# Patient Record
Sex: Male | Born: 1960
Health system: Southern US, Community
[De-identification: ages and names within clinical notes are randomized; demographics above are authoritative.]

## PROBLEM LIST (undated history)

## (undated) DIAGNOSIS — I10 Essential (primary) hypertension: Secondary | ICD-10-CM

---

## 2004-07-30 ENCOUNTER — Ambulatory Visit (HOSPITAL_COMMUNITY): Admission: RE | Admit: 2004-07-30 | Discharge: 2004-07-30 | Payer: Self-pay | Admitting: Internal Medicine

## 2010-09-20 ENCOUNTER — Encounter: Payer: Self-pay | Admitting: Internal Medicine

## 2014-01-18 ENCOUNTER — Ambulatory Visit: Payer: BC Managed Care – PPO | Admitting: Family Medicine

## 2014-01-18 VITALS — BP 130/72 | HR 90 | Temp 98.0°F | Resp 18 | Ht 69.0 in | Wt 151.0 lb

## 2014-01-18 DIAGNOSIS — B356 Tinea cruris: Secondary | ICD-10-CM

## 2014-01-18 MED ORDER — TERBINAFINE HCL 1 % EX CREA: 1.0000 "application " | TOPICAL_CREAM | Freq: Two times a day (BID) | CUTANEOUS | Status: DC

## 2014-01-18 NOTE — Progress Notes (Signed)
   Subjective:    Patient ID: George Andrews, male    DOB: March 01, 1961, 53 y.o.   MRN: [id number]  HPI Patient presents today with a rash on his inner, upper thighs for about 3 weeks. He used A&D ointment and it seemed like it was getting better, then got worse. He is currently using A&D alternating with baby powder.   No itch, no drainage. He has never had a rash like this before.  Patient works third shift and walks a lot, this is irritating when his skin rubs together.  Patient sees Dr. Rande LawmanErwin ? on [address]for regular primary care. Is planning to go in for a regular check up and schedule his colonoscopy within the next couple of months.   Review of Systems No fever/chills, no lesions on testicles, penis.    Objective:   Physical Exam  Vitals reviewed. Constitutional: He is oriented to person, place, and time. He appears well-developed and well-nourished.  Eyes: Conjunctivae are normal. Right eye exhibits no discharge. Left eye exhibits no discharge. No scleral icterus.  Neck: Normal range of motion. Neck supple.  Pulmonary/Chest: Effort normal.  Musculoskeletal: Normal range of motion.  Neurological: He is alert and oriented to person, place, and time.  Skin: Skin is warm and dry. Rash noted.  Bilateral upper inner thighs with 2 inch, round erythematous, flat, discoloration with a darker boarder. Evidence of powder.  Psychiatric: He has a normal mood and affect. His behavior is normal. Judgment and thought content normal.       Assessment & Plan:  1. Tinea of groin - terbinafine (LAMISIL AT) 1 % cream; Apply 1 application topically 2 (two) times daily.  Dispense: 30 g; Refill: 0. Instructed patient to use until rash resolved plus one week. -did not do skin scraping today due to copious amount of powder on skin -Patient to return if no improvement in 3 weeks, or sooner if worsening/spreading. -Recommended compression shorts to prevent skin from rubbing.  Emi Belfasteborah B.  Jazmynn Pho, FNP-BC  [facility]  01/18/2014 11:55 AM

## 2014-01-18 NOTE — Patient Instructions (Signed)
Use prescription cream twice a day until rash resolved then for 1 more week, If not better in 2 weeks or if worse, RTC.  Jock Itch Jock itch is a fungal infection of the skin in the groin area. It is sometimes called "ringworm" even though it is not caused by a worm. A fungus is a type of germ that thrives in dark, damp places.  CAUSES  This infection may spread from:  A fungus infection elsewhere on the body (such as athlete's foot).  Sharing towels or clothing. This infection is more common in:  Hot, humid climates.  People who wear tight-fitting clothing or wet bathing suits for long periods of time.  Athletes.  Overweight people.  People with diabetes. SYMPTOMS  Jock itch causes the following symptoms:  Red, pink or brown rash in the groin. Rash may spread to the thighs, anus, and buttocks.  Itching. DIAGNOSIS  Your caregiver may make the diagnosis by looking at the rash. Sometimes a skin scraping will be sent to test for fungus. Testing can be done either by looking under the microscope or by doing a culture (test to try to grow the fungus). A culture can take up to 2 weeks to come back. TREATMENT  Jock itch may be treated with:  Skin cream or ointment to kill fungus.  Medicine by mouth to kill fungus.  Skin cream or ointment to calm the itching.  Compresses or medicated powders to dry the infected skin. HOME CARE INSTRUCTIONS   Be sure to treat the rash completely. Follow your caregiver's instructions. It can take a couple of weeks to treat. If you do not treat the infection long enough, the rash can come back.  Wear loose-fitting clothing.  Men should wear cotton boxer shorts.  Women should wear cotton underwear.  Avoid hot baths.  Dry the groin area well after bathing. SEEK MEDICAL CARE IF:   Your rash is worse.  Your rash is spreading.  Your rash returns after treatment is finished.  Your rash is not gone in 4 weeks. Fungal infections are slow to  respond to treatment. Some redness may remain for several weeks after the fungus is gone. SEEK IMMEDIATE MEDICAL CARE IF:  The area becomes red, warm, tender, and swollen.  You have a fever. Document Released: 08/07/2002 Document Revised: 11/09/2011 Document Reviewed: 07/06/2008 [facility] Patient Information 2014 [employer].

## 2014-11-05 ENCOUNTER — Other Ambulatory Visit (HOSPITAL_COMMUNITY): Payer: Self-pay

## 2014-11-05 ENCOUNTER — Emergency Department (HOSPITAL_COMMUNITY): Payer: BLUE CROSS/BLUE SHIELD

## 2014-11-05 ENCOUNTER — Encounter (HOSPITAL_COMMUNITY): Payer: Self-pay | Admitting: Emergency Medicine

## 2014-11-05 ENCOUNTER — Inpatient Hospital Stay (HOSPITAL_COMMUNITY)
Admission: EM | Admit: 2014-11-05 | Discharge: 2014-11-06 | DRG: 087 | Disposition: A | Discharge status: Home / Self Care | Payer: BLUE CROSS/BLUE SHIELD | Attending: Internal Medicine | Admitting: Internal Medicine

## 2014-11-05 DIAGNOSIS — S066X0A Traumatic subarachnoid hemorrhage without loss of consciousness, initial encounter: Secondary | ICD-10-CM | POA: Diagnosis not present

## 2014-11-05 DIAGNOSIS — I10 Essential (primary) hypertension: Secondary | ICD-10-CM | POA: Diagnosis present

## 2014-11-05 DIAGNOSIS — Y908 Blood alcohol level of 240 mg/100 ml or more: Secondary | ICD-10-CM | POA: Diagnosis present

## 2014-11-05 DIAGNOSIS — I609 Nontraumatic subarachnoid hemorrhage, unspecified: Secondary | ICD-10-CM | POA: Diagnosis not present

## 2014-11-05 DIAGNOSIS — R030 Elevated blood-pressure reading, without diagnosis of hypertension: Secondary | ICD-10-CM

## 2014-11-05 DIAGNOSIS — F1010 Alcohol abuse, uncomplicated: Secondary | ICD-10-CM | POA: Diagnosis not present

## 2014-11-05 DIAGNOSIS — F10129 Alcohol abuse with intoxication, unspecified: Secondary | ICD-10-CM | POA: Diagnosis not present

## 2014-11-05 DIAGNOSIS — F10120 Alcohol abuse with intoxication, uncomplicated: Secondary | ICD-10-CM | POA: Diagnosis present

## 2014-11-05 DIAGNOSIS — S066X9A Traumatic subarachnoid hemorrhage with loss of consciousness of unspecified duration, initial encounter: Secondary | ICD-10-CM

## 2014-11-05 DIAGNOSIS — IMO0001 Reserved for inherently not codable concepts without codable children: Secondary | ICD-10-CM | POA: Diagnosis present

## 2014-11-05 DIAGNOSIS — W1830XA Fall on same level, unspecified, initial encounter: Secondary | ICD-10-CM | POA: Diagnosis present

## 2014-11-05 DIAGNOSIS — R4020 Unspecified coma: Secondary | ICD-10-CM

## 2014-11-05 DIAGNOSIS — R55 Syncope and collapse: Secondary | ICD-10-CM | POA: Diagnosis not present

## 2014-11-05 LAB — I-STAT CHEM 8, ED
BUN: 12 mg/dL (ref 6–23)
Calcium, Ion: 0.92 mmol/L — ABNORMAL LOW (ref 1.12–1.23)
Chloride: 103 mmol/L (ref 96–112)
Creatinine, Ser: 1.1 mg/dL (ref 0.50–1.35)
Glucose, Bld: 114 mg/dL — ABNORMAL HIGH (ref 70–99)
HCT: 52 % (ref 39.0–52.0)
Hemoglobin: 17.7 g/dL — ABNORMAL HIGH (ref 13.0–17.0)
Potassium: 6 mmol/L — ABNORMAL HIGH (ref 3.5–5.1)
Sodium: 141 mmol/L (ref 135–145)
TCO2: 24 mmol/L (ref 0–100)

## 2014-11-05 LAB — CBC WITH DIFFERENTIAL/PLATELET
Basophils Absolute: 0 10*3/uL (ref 0.0–0.1)
Basophils Relative: 0 % (ref 0–1)
Eosinophils Absolute: 0 10*3/uL (ref 0.0–0.7)
Eosinophils Relative: 0 % (ref 0–5)
HCT: 47.6 % (ref 39.0–52.0)
Hemoglobin: 16.4 g/dL (ref 13.0–17.0)
Lymphocytes Relative: 17 % (ref 12–46)
Lymphs Abs: 1.7 10*3/uL (ref 0.7–4.0)
MCH: 32.2 pg (ref 26.0–34.0)
MCHC: 34.5 g/dL (ref 30.0–36.0)
MCV: 93.5 fL (ref 78.0–100.0)
Monocytes Absolute: 0.8 10*3/uL (ref 0.1–1.0)
Monocytes Relative: 8 % (ref 3–12)
Neutro Abs: 8 10*3/uL — ABNORMAL HIGH (ref 1.7–7.7)
Neutrophils Relative %: 75 % (ref 43–77)
Platelets: 196 10*3/uL (ref 150–400)
RBC: 5.09 MIL/uL (ref 4.22–5.81)
RDW: 14.4 % (ref 11.5–15.5)
WBC: 10.5 10*3/uL (ref 4.0–10.5)

## 2014-11-05 LAB — COMPREHENSIVE METABOLIC PANEL
ALT: 72 U/L — ABNORMAL HIGH (ref 0–53)
AST: 71 U/L — ABNORMAL HIGH (ref 0–37)
Albumin: 4 g/dL (ref 3.5–5.2)
Alkaline Phosphatase: 66 U/L (ref 39–117)
Anion gap: 11 (ref 5–15)
BUN: 9 mg/dL (ref 6–23)
CO2: 25 mmol/L (ref 19–32)
Calcium: 8.6 mg/dL (ref 8.4–10.5)
Chloride: 104 mmol/L (ref 96–112)
Creatinine, Ser: 0.76 mg/dL (ref 0.50–1.35)
GFR calc Af Amer: >90 mL/min (ref >90)
GFR calc non Af Amer: >90 mL/min (ref >90)
Glucose, Bld: 108 mg/dL — ABNORMAL HIGH (ref 70–99)
Potassium: 4.1 mmol/L (ref 3.5–5.1)
Sodium: 140 mmol/L (ref 135–145)
Total Bilirubin: 1.1 mg/dL (ref 0.3–1.2)
Total Protein: 8.1 g/dL (ref 6.0–8.3)

## 2014-11-05 LAB — ETHANOL: Alcohol, Ethyl (B): 256 mg/dL — ABNORMAL HIGH (ref 0–9)

## 2014-11-05 LAB — CBG MONITORING, ED: Glucose-Capillary: 139 mg/dL — ABNORMAL HIGH (ref 70–99)

## 2014-11-05 LAB — MAGNESIUM: Magnesium: 2.2 mg/dL (ref 1.5–2.5)

## 2014-11-05 MED ORDER — OXYCODONE-ACETAMINOPHEN 5-325 MG PO TABS
1.0000 | ORAL_TABLET | ORAL | Status: DC | PRN
  Administered 2014-11-05 – 2014-11-06 (×4): 2 via ORAL
  Filled 2014-11-05 (×5): qty 2

## 2014-11-05 MED ORDER — DIPHENHYDRAMINE HCL 50 MG/ML IJ SOLN
12.5000 mg | Freq: Once | INTRAMUSCULAR | Status: AC
  Administered 2014-11-05: 12.5 mg via INTRAVENOUS
  Filled 2014-11-05: qty 1

## 2014-11-05 MED ORDER — LORAZEPAM 2 MG/ML IJ SOLN: 1.0000 mg | Freq: Four times a day (QID) | INTRAMUSCULAR | Status: DC | PRN

## 2014-11-05 MED ORDER — MORPHINE SULFATE 2 MG/ML IJ SOLN
2.0000 mg | INTRAMUSCULAR | Status: DC | PRN
  Administered 2014-11-05: 2 mg via INTRAVENOUS
  Filled 2014-11-05: qty 1

## 2014-11-05 MED ORDER — THIAMINE HCL 100 MG/ML IJ SOLN: 100.0000 mg | Freq: Every day | INTRAMUSCULAR | Status: DC

## 2014-11-05 MED ORDER — ADULT MULTIVITAMIN W/MINERALS CH
1.0000 | ORAL_TABLET | Freq: Every day | ORAL | Status: DC
  Administered 2014-11-05 – 2014-11-06 (×2): 1 via ORAL
  Filled 2014-11-05 (×2): qty 1

## 2014-11-05 MED ORDER — ACETAMINOPHEN 650 MG RE SUPP: 650.0000 mg | Freq: Four times a day (QID) | RECTAL | Status: DC | PRN

## 2014-11-05 MED ORDER — IOHEXOL 350 MG/ML SOLN
50.0000 mL | Freq: Once | INTRAVENOUS | Status: AC | PRN
  Administered 2014-11-05: 50 mL via INTRAVENOUS

## 2014-11-05 MED ORDER — LORAZEPAM 1 MG PO TABS: 1.0000 mg | ORAL_TABLET | Freq: Four times a day (QID) | ORAL | Status: DC | PRN

## 2014-11-05 MED ORDER — FOLIC ACID 1 MG PO TABS
1.0000 mg | ORAL_TABLET | Freq: Every day | ORAL | Status: DC
  Administered 2014-11-05 – 2014-11-06 (×2): 1 mg via ORAL
  Filled 2014-11-05 (×2): qty 1

## 2014-11-05 MED ORDER — ACETAMINOPHEN 325 MG PO TABS
650.0000 mg | ORAL_TABLET | Freq: Four times a day (QID) | ORAL | Status: DC | PRN
Start: 2014-11-05 — End: 2014-11-06
  Administered 2014-11-05: 650 mg via ORAL
  Filled 2014-11-05: qty 2

## 2014-11-05 MED ORDER — METOCLOPRAMIDE HCL 5 MG/ML IJ SOLN
10.0000 mg | Freq: Once | INTRAMUSCULAR | Status: AC
  Administered 2014-11-05: 10 mg via INTRAVENOUS
  Filled 2014-11-05: qty 2

## 2014-11-05 MED ORDER — VITAMIN B-1 100 MG PO TABS
100.0000 mg | ORAL_TABLET | Freq: Every day | ORAL | Status: DC
  Administered 2014-11-05 – 2014-11-06 (×2): 100 mg via ORAL
  Filled 2014-11-05 (×2): qty 1

## 2014-11-05 NOTE — ED Notes (Signed)
Pt given sandwich 

## 2014-11-05 NOTE — Progress Notes (Signed)
Pt in room 4N29. Pt oriented to unit and equipment. Call bell and phone within reach. BP on arrival to unit was 175/73, otherwise VSS. MD made aware that pt on unit. Will continue to monitor.

## 2014-11-05 NOTE — H&P (Signed)
Date: 11/05/2014               Patient Name:  George Andrews MRN: [id number]  DOB: [birth year] Age / Sex: 54 y.o., male   PCP: No primary care provider on file.              Medical Service: Internal Medicine Teaching Service              Attending Physician: Dr. Earl Lagos, MD    First Contact: Dr. Leatha Gilding  Pager: [phone]  Second Contact: Dr. Mariea Clonts Pager: [phone]            After Hours (After 5p/  First Contact Pager: [phone]  weekends / holidays): Second Contact Pager: [phone]   Chief Complaint: Severe headache  History of Present Illness: This is a 54 year old gentleman without significant past medical history except alcohol abuse who presents with severe headaches. Patient reports that last night he went to the kitchen to get some drinking water and fell down. He woke up this morning on the floor with severe frontal headache. He denies vomiting, photophobia, fevers or chills. He states that he had been drinking whole day yesterday and he that he consumed 1 pint of Brandy (liquor). He states that he drinks daily, "a few drinks" of liquor. He describes his headache as throbbing, frontal, constant, without relieving or aggravating factors. He denies neck pain or stiffness. No visual or auditory symptoms. No lightheadedness. He is able to ambulate without support. He does have a small cut on his right ear. No past medical history of seizures or similar episodes of passing out. Alcohol level is 256. Head CT scan without contrast in the ED revealed acute subarachnoid hemorrhage in the medullary cistern, pre-pontine cistern and suprasellar cistern. Follow-up CTA of the head was negative for aneurysm. Internal medicine was consulted for admission and neurosurgery will be seeing the patient in consultation.  Review of Systems: Constitutional: No fever, chills, diaphoresis, appetite change and fatigue.  HEENT: No URI symptoms.  Respiratory: No SOB, DOE, cough, chest tightness, and  wheezing. No hemoptysis.  Cardiovascular: No chest pain, palpitations and leg swelling. No PND or Orthopnea. Gastrointestinal: No N/V or diarrhea. No abdominal pain. No hematochezia or melena.  Genitourinary: No dysuria, urgency, frequency, hematuria, flank pain and difficulty urinating.  Musculoskeletal: No myalgias, back pain, joint swelling, arthralgias and gait problem.  Skin: No rash and wound. No easy bruising. Psychiatric/Behavioral: No SI, mood changes, confusion, nervousness, sleep disturbance and agitation  Meds: Current Facility-Administered Medications  Medication Dose Route Frequency Provider Last Rate Last Dose  . acetaminophen (TYLENOL) tablet 650 mg  650 mg Oral Q6H PRN Dow Adolph, MD   650 mg at 11/05/14 1349   Or  . acetaminophen (TYLENOL) suppository 650 mg  650 mg Rectal Q6H PRN Dow Adolph, MD      . folic acid (FOLVITE) tablet 1 mg  1 mg Oral Daily Dow Adolph, MD   1 mg at 11/05/14 1257  . LORazepam (ATIVAN) tablet 1 mg  1 mg Oral Q6H PRN Dow Adolph, MD       Or  . LORazepam (ATIVAN) injection 1 mg  1 mg Intravenous Q6H PRN Dow Adolph, MD      . multivitamin with minerals tablet 1 tablet  1 tablet Oral Daily Dow Adolph, MD   1 tablet at 11/05/14 1257  . thiamine (VITAMIN B-1) tablet 100 mg  100 mg Oral Daily Dow Adolph, MD   100 mg at 11/05/14 1258  Or  . thiamine (B-1) injection 100 mg  100 mg Intravenous Daily Dow Adolphichard Demarkis Gheen, MD        Allergies: Allergies as of 11/05/2014  . (No Known Allergies)   History reviewed. No pertinent past medical history. History reviewed. No pertinent past surgical history. No family history on file. History   Social History  . Marital Status: Single    Spouse Name: N/A  . Number of Children: N/A  . Years of Education: N/A   Occupational History  . Not on file.   Social History Main Topics  . Smoking status: Never Smoker   . Smokeless tobacco: Not on file  . Alcohol Use: Yes  . Drug  Use: Not on file  . Sexual Activity: Not on file   Other Topics Concern  . Not on file   Social History Narrative    Physical Exam: Blood pressure 175/73, pulse 81, temperature 99.7 F (37.6 C), temperature source Oral, resp. rate 15, SpO2 98 %.  General: Smell of alcohol in his breath. well developed, well nourished; no acute distress, cooperative with exam HEENT: Neck is supple. Head is Atraumatic except a small cut on his right pinna. Not actively bleeding. Normal EOM. Pupils equal, round and reactive; anicteric. Nose/throat: oropharynx clear, moist mucous membranes, pink gums  Lungs/Chest wall: CTA bil, normal work of breathing  Heart: normal RRR; no murmurs. No JVD Pulses: radial and dorsalis pedis pulses are 2+ and symmetric  Abdomen: Normal fullness, no rebound, guarding, or rigidity; nl BS; no palpable masses.  Skin: warm, dry, intact, normal turgor, no rashes  Extremities: no peripheral edema, clubbing, or cyanosis  Neurologic Exam:   Mental Status: Alert, oriented, thought content appropriate.  Speech fluent without evidence of aphasia. Able to follow 3 step commands without difficulty.  Cranial Nerves:   II: Visual fields grossly intact.  III/IV/VI: Extraocular movements intact.  Pupils reactive bilaterally.  V/VII: Smile symmetric. Facial light touch sensation normal bilaterally.  VIII: Grossly intact.  XI: Bilateral shoulder shrug normal.  XII: Midline tongue extension normal.  Motor:  5/5 bilaterally with normal tone and bulk  Sensory:  Pinprick and light touch intact throughout, bilaterally  DTRs: 2+ and symmetric throughout  Plantars:  Downgoing bilaterally  Cerebellar: Normal finger-to-nose, normal rapid alternating movements and normal heel-to-shin test.  Gait not tested.   Psych: Normal mood and affect. Normal speech and behavior. No agitation   Lab results: Basic Metabolic Panel:  Recent Labs  46/96/2902/03/15 0808 11/05/14 0838  NA 141 140  K 6.0* 4.1    CL 103 104  CO2  --  25  GLUCOSE 114* 108*  BUN 12 9  CREATININE 1.10 0.76  CALCIUM  --  8.6  MG  --  2.2   Liver Function Tests:  Recent Labs  11/05/14 0838  AST 71*  ALT 72*  ALKPHOS 66  BILITOT 1.1  PROT 8.1  ALBUMIN 4.0   CBC:  Recent Labs  11/05/14 0808 11/05/14 0838  WBC  --  10.5  NEUTROABS  --  8.0*  HGB 17.7* 16.4  HCT 52.0 47.6  MCV  --  93.5  PLT  --  196   CBG:  Recent Labs  11/05/14 0541  GLUCAP 139*   Alcohol Level:  Recent Labs  11/05/14 0800  ETH 256*    Imaging results:  Ct Angio Head W/cm &/or Wo Cm  11/05/2014   CLINICAL DATA:  Fall related to alcohol intoxication last night. Awoke on floor. Laceration to  the right ear. Frontal headache. Subarachnoid hemorrhage  EXAM: CT ANGIOGRAPHY HEAD  TECHNIQUE: Multidetector CT imaging of the head was performed using the standard protocol during bolus administration of intravenous contrast. Multiplanar CT image reconstructions and MIPs were obtained to evaluate the vascular anatomy.  CONTRAST:  50mL OMNIPAQUE IOHEXOL 350 MG/ML SOLN  COMPARISON:  CT head without contrast from the same day.  FINDINGS: CT HEAD  Brain: Pre pontine subarachnoid hemorrhage is again noted. There is asymmetric hemorrhage at the left CP angle. No acute infarct is evident.  Calvarium and skull base: A 6 mm hyperdensity is noted in the right external auditory canal at the junction of the membranous an osseous segments. The calvarium and skullbase are otherwise unremarkable.  Paranasal sinuses: Clear  Orbits: Within normal limits  CTA HEAD  Anterior circulation: Atherosclerotic calcifications are present within the ophthalmic segments of the internal carotid arteries bilaterally. There is some irregularity throughout the cavernous internal carotid arteries. The terminal ICA is normal bilaterally. The A1 and M1 segments are normal. The anterior communicating artery is patent. The MCA bifurcations are within normal limits. There is some  attenuation of distal MCA branch vessels bilaterally without a significant proximal stenosis or occlusion.  Posterior circulation: The vertebral arteries are codominant. The right PICA origin is below the foramen magnum. The left PICA origin is visualized and within normal limits. There is no aneurysm. The vertebrobasilar junction is normal. The basilar artery is within normal limits. The posterior cerebral arteries both originate from the basilar tip. There is significant contribution on the right from a posterior communicating artery. There is some attenuation of distal PCA branch vessels similar to the MCA disease.  Venous sinuses: The dural sinuses are patent. The left transverse sinus is dominant.  Anatomic variants: None.  Delayed phase:No postcontrast enhancement is evident. Of note, subarachnoid hemorrhage does not seem to enter the interpeduncular cistern.  IMPRESSION: 1. Pre pontine and left CP angle subarachnoid hemorrhage is stable. 2. No discrete aneurysm or evidence for focal dissection. The pattern is typical of non aneurysmal subarachnoid hemorrhage in the posterior fossa. 3. Mild atherosclerotic changes within the cavernous and ophthalmic segments of the internal carotid arteries. 4. Segmental attenuation of distal MCA and PCA branch vessels bilaterally suggesting atherosclerotic change or vasculitis. 5. 6 mm hyperdensity within the right external auditory canal. Recommend direct visualization to evaluate for debris versus cutaneous or subcutaneous lesion.   Electronically Signed   By: Marin Roberts M.D.   On: 11/05/2014 10:13   Dg Chest 2 View  11/05/2014   CLINICAL DATA:  Fall, hit head with headache  EXAM: CHEST  2 VIEW  COMPARISON:  None.  FINDINGS: Normal mediastinum and cardiac silhouette. Normal pulmonary vasculature. No evidence of effusion, infiltrate, or pneumothorax. No acute bony abnormality.  IMPRESSION: No acute cardiopulmonary process.   Electronically Signed   By: Genevive Bi M.D.   On: 11/05/2014 10:16   Ct Head Wo Contrast  11/05/2014   CLINICAL DATA:  Frontal headache. Fall/syncope. Laceration along the right ear.  EXAM: CT HEAD WITHOUT CONTRAST  TECHNIQUE: Contiguous axial images were obtained from the base of the skull through the vertex without intravenous contrast.  COMPARISON:  None.  FINDINGS: There is abnormal subarachnoid hemorrhage at the foramen magnum and tracking in the medullary cistern, the prepontine cistern, and suprasellar cistern. There is likely a small amount of hemorrhage in the fourth ventricle inferiorly.  Otherwise, the brainstem, cerebellum, cerebral peduncles, thalamus, basal ganglia, basilar cisterns, and ventricular  system appear within normal limits. No mass lesion or acute CVA is identified.  IMPRESSION: 1. Acute subarachnoid hemorrhage in the medullary cistern, pre pontine cistern, and suprasellar cistern. Cannot exclude a small amount of hemorrhage in the fourth ventricle inferiorly. Critical Value/emergent results were called by telephone at the time of interpretation on 11/05/2014 at 7:50 am to Dr. Charlestine Night , who verbally acknowledged these results.   Electronically Signed   By: Gaylyn Rong M.D.   On: 11/05/2014 07:50   Ct Cervical Spine Wo Contrast  11/05/2014   CLINICAL DATA:  Patient woke up on floor. Does not remember following.  EXAM: CT CERVICAL SPINE WITHOUT CONTRAST  TECHNIQUE: Multidetector CT imaging of the cervical spine was performed without intravenous contrast. Multiplanar CT image reconstructions were also generated.  COMPARISON:  None.  FINDINGS: Normal alignment. Prevertebral soft tissues are normal. Disc spaces are maintained with early anterior spurring. No fracture. No epidural or paraspinal hematoma.  Severe emphysematous changes noted in the lung apices. Complete lucency in the visualize left apex is presumably a large bulla although it is difficult to completely exclude pneumothorax on this limited  visualization. May consider chest x-ray to confirm.  IMPRESSION: No acute bony abnormality.  Severe emphysematous changes in the apices. Visualized lucency in the left apex is presumably a large bulla although it is difficult to completely exclude pneumothorax on these limited images. May consider chest x-ray.   Electronically Signed   By: Charlett Nose M.D.   On: 11/05/2014 08:47    Other results: EKG: normal EKG, normal sinus rhythm.  Assessment & Plan by Problem: Principal Problem:   Subarachnoid hemorrhage Active Problems:   Alcohol abuse with intoxication   Elevated blood pressure  Subarachnoid hemorrhage: Likely secondary to head trauma with a fall in the setting of alcohol intoxication. Other causes of syncope are not supported by history and physical exam. No signs of meningeal irritation. EKG without abnormalities. Seborrheic hemorrhage is evident on head CT without contrast and CTA with involvement of medullary cistern, prepontine cistern and suprasellar cistern. No aneurysms. Cervical CT scan without contrast does not reveal cervical fracture. Chest x-ray without acute cardiopulmonary process, specifically no pneumothorax. Exam is otherwise normal.   Plan -Admit to MedSurg -Frequent neurologic monitoring. -Aggressive treatment of elevated BP to avoid further bleeding. Keep SBP <160 mmHg -Will hold off on Prophylactic antiepileptic drug since the bleed appears small -Tylenol for headache prn -Neurosurgery has been consulted in the ED. EDP stated that they did not feel that surgery is indicated.  Elevated blood pressure: Patient does not have a past medical history of hypertension.  Plan -Start Amlodipine 5 mg daily -IV Hydralazine 5 mg every 4hr prn for SBP of above 1 60 mmHg -Titrate oral antihypertensives based on blood pressure  Alcohol abuse: Slight elevation in liver function test likely related to alcohol. Patient counseled on alcohol cessation. Plan -Will start on CIWA  protocol.  -Check liver functions and if they remain elevated, may pursue further etiologies including hepatitis.  Minor Ear Laceration: No intervention required.  Code Status: Full code  F/E/N: regular diet  VTE Ppx: SCDs. Avoid heparin products in the setting of SAH  Family Communication: Discussed a plan of care with the patient. All questions answered.  Dispo: Disposition is deferred at this time, awaiting improvement of current medical problems. Anticipated discharge in approximately 1-2 day(s).   The patient does not have a current PCP (No primary care provider on file.), therefore will be require [facility]PC follow-up  after discharge.   The patient does not have transportation limitations that hinder transportation to clinic appointments.  Signed:  Dow Adolph, MD PGY-3 Internal Medicine Teaching Service Pager [phone] 11/05/2014, 1:56 PM

## 2014-11-05 NOTE — Consult Note (Signed)
Reason for Consult: Subarachnoid hemorrhage Referring Physician: Dr. Leroy Libman is an 54 y.o. male.  HPI: The patient is a 54 year old black male who woke up with a headache in the early a.m. on 11/05/2014. The patient then stood up and either fell or had a syncopal event. EMS was called and the patient was taken to [facility] emerged part where head CT was obtained. As demonstrated a subarachnoid hemorrhage. The patient has been admitted by the hospitalists and a neurosurgical consultation has been requested.  Presently the patient is alert and pleasant. He admits to a headache. He denies trauma other than the fall after he had a headache.  History reviewed. No pertinent past medical history.  History reviewed. No pertinent past surgical history.  No family history on file.  Social History:  reports that he has been smoking.  He does not have any smokeless tobacco history on file. He reports that he drinks alcohol. His drug history is not on file.  Allergies: No Known Allergies  Medications:  I have reviewed the patient's current medications. Prior to Admission:  Prescriptions prior to admission  Medication Sig Dispense Refill Last Dose  . Multiple Vitamins-Minerals (CENTRUM ADULTS PO) Take 1 tablet by mouth daily.   11/04/2014 at Unknown time  . terbinafine (LAMISIL AT) 1 % cream Apply 1 application topically 2 (two) times daily. (Patient not taking: Reported on 11/05/2014) 30 g 0    Scheduled: . folic acid  1 mg Oral Daily  . multivitamin with minerals  1 tablet Oral Daily  . thiamine  100 mg Oral Daily   Or  . thiamine  100 mg Intravenous Daily   Continuous:  VVK:[id number] **OR** acetaminophen, LORazepam **OR** LORazepam, morphine injection, oxyCODONE-acetaminophen Anti-infectives    None       Results for orders placed or performed during the hospital encounter of 11/05/14 (from the past 48 hour(s))  CBG monitoring, ED     Status: Abnormal   Collection  Time: 11/05/14  5:41 AM  Result Value Ref Range   Glucose-Capillary 139 (H) 70 - 99 mg/dL  Ethanol     Status: Abnormal   Collection Time: 11/05/14  8:00 AM  Result Value Ref Range   Alcohol, Ethyl (B) 256 (H) 0 - 9 mg/dL    Comment:        LOWEST DETECTABLE LIMIT FOR SERUM ALCOHOL IS 11 mg/dL FOR MEDICAL PURPOSES ONLY   I-stat Chem 8, ED     Status: Abnormal   Collection Time: 11/05/14  8:08 AM  Result Value Ref Range   Sodium 141 135 - 145 mmol/L   Potassium 6.0 (H) 3.5 - 5.1 mmol/L   Chloride 103 96 - 112 mmol/L   BUN 12 6 - 23 mg/dL   Creatinine, Ser 1.10 0.50 - 1.35 mg/dL   Glucose, Bld 114 (H) 70 - 99 mg/dL   Calcium, Ion 0.92 (L) 1.12 - 1.23 mmol/L   TCO2 24 0 - 100 mmol/L   Hemoglobin 17.7 (H) 13.0 - 17.0 g/dL   HCT 52.0 39.0 - 52.0 %  Comprehensive metabolic panel     Status: Abnormal   Collection Time: 11/05/14  8:38 AM  Result Value Ref Range   Sodium 140 135 - 145 mmol/L   Potassium 4.1 3.5 - 5.1 mmol/L   Chloride 104 96 - 112 mmol/L   CO2 25 19 - 32 mmol/L   Glucose, Bld 108 (H) 70 - 99 mg/dL   BUN 9 6 - 23  mg/dL   Creatinine, Ser 0.76 0.50 - 1.35 mg/dL   Calcium 8.6 8.4 - 10.5 mg/dL   Total Protein 8.1 6.0 - 8.3 g/dL   Albumin 4.0 3.5 - 5.2 g/dL   AST 71 (H) 0 - 37 U/L   ALT 72 (H) 0 - 53 U/L   Alkaline Phosphatase 66 39 - 117 U/L   Total Bilirubin 1.1 0.3 - 1.2 mg/dL   GFR calc non Af Amer >90 >90 mL/min   GFR calc Af Amer >90 >90 mL/min    Comment: (NOTE) The eGFR has been calculated using the CKD EPI equation. This calculation has not been validated in all clinical situations. eGFR's persistently <90 mL/min signify possible Chronic Kidney Disease.    Anion gap 11 5 - 15  CBC with Differential     Status: Abnormal   Collection Time: 11/05/14  8:38 AM  Result Value Ref Range   WBC 10.5 4.0 - 10.5 K/uL   RBC 5.09 4.22 - 5.81 MIL/uL   Hemoglobin 16.4 13.0 - 17.0 g/dL   HCT 47.6 39.0 - 52.0 %   MCV 93.5 78.0 - 100.0 fL   MCH 32.2 26.0 - 34.0 pg    MCHC 34.5 30.0 - 36.0 g/dL   RDW 14.4 11.5 - 15.5 %   Platelets 196 150 - 400 K/uL   Neutrophils Relative % 75 43 - 77 %   Neutro Abs 8.0 (H) 1.7 - 7.7 K/uL   Lymphocytes Relative 17 12 - 46 %   Lymphs Abs 1.7 0.7 - 4.0 K/uL   Monocytes Relative 8 3 - 12 %   Monocytes Absolute 0.8 0.1 - 1.0 K/uL   Eosinophils Relative 0 0 - 5 %   Eosinophils Absolute 0.0 0.0 - 0.7 K/uL   Basophils Relative 0 0 - 1 %   Basophils Absolute 0.0 0.0 - 0.1 K/uL  Magnesium     Status: None   Collection Time: 11/05/14  8:38 AM  Result Value Ref Range   Magnesium 2.2 1.5 - 2.5 mg/dL    Ct Angio Head W/cm &/or Wo Cm  11/05/2014   CLINICAL DATA:  Fall related to alcohol intoxication last night. Awoke on floor. Laceration to the right ear. Frontal headache. Subarachnoid hemorrhage  EXAM: CT ANGIOGRAPHY HEAD  TECHNIQUE: Multidetector CT imaging of the head was performed using the standard protocol during bolus administration of intravenous contrast. Multiplanar CT image reconstructions and MIPs were obtained to evaluate the vascular anatomy.  CONTRAST:  38m OMNIPAQUE IOHEXOL 350 MG/ML SOLN  COMPARISON:  CT head without contrast from the same day.  FINDINGS: CT HEAD  Brain: Pre pontine subarachnoid hemorrhage is again noted. There is asymmetric hemorrhage at the left CP angle. No acute infarct is evident.  Calvarium and skull base: A 6 mm hyperdensity is noted in the right external auditory canal at the junction of the membranous an osseous segments. The calvarium and skullbase are otherwise unremarkable.  Paranasal sinuses: Clear  Orbits: Within normal limits  CTA HEAD  Anterior circulation: Atherosclerotic calcifications are present within the ophthalmic segments of the internal carotid arteries bilaterally. There is some irregularity throughout the cavernous internal carotid arteries. The terminal ICA is normal bilaterally. The A1 and M1 segments are normal. The anterior communicating artery is patent. The MCA  bifurcations are within normal limits. There is some attenuation of distal MCA branch vessels bilaterally without a significant proximal stenosis or occlusion.  Posterior circulation: The vertebral arteries are codominant. The right PICA origin is  below the foramen magnum. The left PICA origin is visualized and within normal limits. There is no aneurysm. The vertebrobasilar junction is normal. The basilar artery is within normal limits. The posterior cerebral arteries both originate from the basilar tip. There is significant contribution on the right from a posterior communicating artery. There is some attenuation of distal PCA branch vessels similar to the MCA disease.  Venous sinuses: The dural sinuses are patent. The left transverse sinus is dominant.  Anatomic variants: None.  Delayed phase:No postcontrast enhancement is evident. Of note, subarachnoid hemorrhage does not seem to enter the interpeduncular cistern.  IMPRESSION: 1. Pre pontine and left CP angle subarachnoid hemorrhage is stable. 2. No discrete aneurysm or evidence for focal dissection. The pattern is typical of non aneurysmal subarachnoid hemorrhage in the posterior fossa. 3. Mild atherosclerotic changes within the cavernous and ophthalmic segments of the internal carotid arteries. 4. Segmental attenuation of distal MCA and PCA branch vessels bilaterally suggesting atherosclerotic change or vasculitis. 5. 6 mm hyperdensity within the right external auditory canal. Recommend direct visualization to evaluate for debris versus cutaneous or subcutaneous lesion.   Electronically Signed   By: San Morelle M.D.   On: 11/05/2014 10:13   Dg Chest 2 View  11/05/2014   CLINICAL DATA:  Fall, hit head with headache  EXAM: CHEST  2 VIEW  COMPARISON:  None.  FINDINGS: Normal mediastinum and cardiac silhouette. Normal pulmonary vasculature. No evidence of effusion, infiltrate, or pneumothorax. No acute bony abnormality.  IMPRESSION: No acute  cardiopulmonary process.   Electronically Signed   By: Suzy Bouchard M.D.   On: 11/05/2014 10:16   Ct Head Wo Contrast  11/05/2014   CLINICAL DATA:  Frontal headache. Fall/syncope. Laceration along the right ear.  EXAM: CT HEAD WITHOUT CONTRAST  TECHNIQUE: Contiguous axial images were obtained from the base of the skull through the vertex without intravenous contrast.  COMPARISON:  None.  FINDINGS: There is abnormal subarachnoid hemorrhage at the foramen magnum and tracking in the medullary cistern, the prepontine cistern, and suprasellar cistern. There is likely a small amount of hemorrhage in the fourth ventricle inferiorly.  Otherwise, the brainstem, cerebellum, cerebral peduncles, thalamus, basal ganglia, basilar cisterns, and ventricular system appear within normal limits. No mass lesion or acute CVA is identified.  IMPRESSION: 1. Acute subarachnoid hemorrhage in the medullary cistern, pre pontine cistern, and suprasellar cistern. Cannot exclude a small amount of hemorrhage in the fourth ventricle inferiorly. Critical Value/emergent results were called by telephone at the time of interpretation on 11/05/2014 at 7:50 am to Dr. Dalia Heading , who verbally acknowledged these results.   Electronically Signed   By: Van Clines M.D.   On: 11/05/2014 07:50   Ct Cervical Spine Wo Contrast  11/05/2014   CLINICAL DATA:  Patient woke up on floor. Does not remember following.  EXAM: CT CERVICAL SPINE WITHOUT CONTRAST  TECHNIQUE: Multidetector CT imaging of the cervical spine was performed without intravenous contrast. Multiplanar CT image reconstructions were also generated.  COMPARISON:  None.  FINDINGS: Normal alignment. Prevertebral soft tissues are normal. Disc spaces are maintained with early anterior spurring. No fracture. No epidural or paraspinal hematoma.  Severe emphysematous changes noted in the lung apices. Complete lucency in the visualize left apex is presumably a large bulla although it is  difficult to completely exclude pneumothorax on this limited visualization. May consider chest x-ray to confirm.  IMPRESSION: No acute bony abnormality.  Severe emphysematous changes in the apices. Visualized lucency in the left  apex is presumably a large bulla although it is difficult to completely exclude pneumothorax on these limited images. May consider chest x-ray.   Electronically Signed   By: Rolm Baptise M.D.   On: 11/05/2014 08:47    ROS: As above Blood pressure 175/73, pulse 81, temperature 99.7 F (37.6 C), temperature source Oral, resp. rate 15, SpO2 98 %. Physical Exam  General: An alert and pleasant 54 year old black male in no apparent distress.  HEENT: Normocephalic, atraumatic, pupils equal round reactive light, extraocular muscles are intact.  Neck: Supple without masses or deformities.  Thorax: Symmetric  Abdomen: Soft  Extremities: Unremarkable  Neurologic exam: The patient is alert and oriented 3. Glasgow Coma Scale 15. Cranial nerves II through XII were examined bilaterally and grossly normal. Vision and hearing are grossly normal bilaterally. Motor strength is 5 over 5 in his bowel bicep, tricep, handgrip, gastrocnemius, dorsiflexors. Sensory function is intact to light touch sensation in all tested dermatomes bilaterally. Cerebellar function is intact to rapid alternating movements of the upper extremities bilaterally.  I have reviewed the patient's head CT performed without contrast at [facility] today. It demonstrates blood around the brainstem and posterior fossa. There is a lot of blood in the ambient cistern or out in the sylvian fissures.  I've also reviewed the patient's cervical CT. There is no fractures.  I have reviewed the report of the patient's CT angiogram which demonstrates no aneurysms  Assessment/Plan: Nontraumatic subarachnoid hemorrhage: The patient's subarachnoid hemorrhage may be a perimesencephalic hemorrhage. No aneurysms are  identified on the CT angiogram. I don't think the patient needs nimodipine. He will be observed overnight and will plan to repeat his CAT scan tomorrow. If there is no significant progression of bleeding, hydrocephalus, etc. the patient can be discharged tomorrow. I have answered all the patient's questions.  Joie Reamer D 11/05/2014, 3:11 PM

## 2014-11-05 NOTE — ED Provider Notes (Signed)
CSN: [id number]     Arrival date & time 11/05/14  0531 History   First MD Initiated Contact with Patient 11/05/14 [phone]     Chief Complaint  Patient presents with  . Headache  . Alcohol Intoxication     (Consider location/radiation/quality/duration/timing/severity/associated sxs/prior Treatment) HPI Patient presents to the emergency department with a sudden onset of headache and woke up on the floor in his house.  Patient states that he does drink alcohol daily.  The patient is unsure what happened that caused him to fall onto the floor.  Patient states that he did not take any medications prior to arrival for his headache.  Patient denies chest pain, shortness of breath, weakness, dizziness, blurred vision, back pain, neck pain, fever, nausea, vomiting or abdominal pain.  The patient states that he must of hit his ear is a little swollen abrasion to the ear on the right History reviewed. No pertinent past medical history. History reviewed. No pertinent past surgical history. No family history on file. History  Substance Use Topics  . Smoking status: Never Smoker   . Smokeless tobacco: Not on file  . Alcohol Use: Yes    Review of Systems  All other systems negative except as documented in the HPI. All pertinent positives and negatives as reviewed in the HPI.   Allergies  Review of patient's allergies indicates no known allergies.  Home Medications   Prior to Admission medications   Medication Sig Start Date End Date Taking? Authorizing Provider  Multiple Vitamins-Minerals (CENTRUM ADULTS PO) Take 1 tablet by mouth daily.   Yes Historical Provider, MD  terbinafine (LAMISIL AT) 1 % cream Apply 1 application topically 2 (two) times daily. Patient not taking: Reported on 11/05/2014 01/18/14   Emi Belfast, FNP   BP 162/74 mmHg  Pulse 82  Resp 15  SpO2 98% Physical Exam  Constitutional: He is oriented to person, place, and time. He appears well-developed and well-nourished. No  distress.  HENT:  Head: Normocephalic.  Mouth/Throat: Oropharynx is clear and moist.  Eyes: EOM are normal. Pupils are equal, round, and reactive to light.  Neck: Normal range of motion. Neck supple.  Cardiovascular: Normal rate, regular rhythm and normal heart sounds.  Exam reveals no gallop and no friction rub.   No murmur heard. Pulmonary/Chest: Effort normal and breath sounds normal. No respiratory distress.  Neurological: He is alert and oriented to person, place, and time. He exhibits normal muscle tone. Coordination normal.  Skin: Skin is warm and dry. No rash noted. No erythema.  Nursing note and vitals reviewed.   ED Course  Procedures (including critical care time) Labs Review Labs Reviewed  COMPREHENSIVE METABOLIC PANEL - Abnormal; Notable for the following:    Glucose, Bld 108 (*)    AST 71 (*)    ALT 72 (*)    All other components within normal limits  CBC WITH DIFFERENTIAL/PLATELET - Abnormal; Notable for the following:    Neutro Abs 8.0 (*)    All other components within normal limits  CBG MONITORING, ED - Abnormal; Notable for the following:    Glucose-Capillary 139 (*)    All other components within normal limits  I-STAT CHEM 8, ED - Abnormal; Notable for the following:    Potassium 6.0 (*)    Glucose, Bld 114 (*)    Calcium, Ion 0.92 (*)    Hemoglobin 17.7 (*)    All other components within normal limits  ETHANOL  I-STAT CHEM 8, ED  Imaging Review Ct Angio Head W/cm &/or Wo Cm  11/05/2014   CLINICAL DATA:  Fall related to alcohol intoxication last night. Awoke on floor. Laceration to the right ear. Frontal headache. Subarachnoid hemorrhage  EXAM: CT ANGIOGRAPHY HEAD  TECHNIQUE: Multidetector CT imaging of the head was performed using the standard protocol during bolus administration of intravenous contrast. Multiplanar CT image reconstructions and MIPs were obtained to evaluate the vascular anatomy.  CONTRAST:  50mL OMNIPAQUE IOHEXOL 350 MG/ML SOLN   COMPARISON:  CT head without contrast from the same day.  FINDINGS: CT HEAD  Brain: Pre pontine subarachnoid hemorrhage is again noted. There is asymmetric hemorrhage at the left CP angle. No acute infarct is evident.  Calvarium and skull base: A 6 mm hyperdensity is noted in the right external auditory canal at the junction of the membranous an osseous segments. The calvarium and skullbase are otherwise unremarkable.  Paranasal sinuses: Clear  Orbits: Within normal limits  CTA HEAD  Anterior circulation: Atherosclerotic calcifications are present within the ophthalmic segments of the internal carotid arteries bilaterally. There is some irregularity throughout the cavernous internal carotid arteries. The terminal ICA is normal bilaterally. The A1 and M1 segments are normal. The anterior communicating artery is patent. The MCA bifurcations are within normal limits. There is some attenuation of distal MCA branch vessels bilaterally without a significant proximal stenosis or occlusion.  Posterior circulation: The vertebral arteries are codominant. The right PICA origin is below the foramen magnum. The left PICA origin is visualized and within normal limits. There is no aneurysm. The vertebrobasilar junction is normal. The basilar artery is within normal limits. The posterior cerebral arteries both originate from the basilar tip. There is significant contribution on the right from a posterior communicating artery. There is some attenuation of distal PCA branch vessels similar to the MCA disease.  Venous sinuses: The dural sinuses are patent. The left transverse sinus is dominant.  Anatomic variants: None.  Delayed phase:No postcontrast enhancement is evident. Of note, subarachnoid hemorrhage does not seem to enter the interpeduncular cistern.  IMPRESSION: 1. Pre pontine and left CP angle subarachnoid hemorrhage is stable. 2. No discrete aneurysm or evidence for focal dissection. The pattern is typical of non aneurysmal  subarachnoid hemorrhage in the posterior fossa. 3. Mild atherosclerotic changes within the cavernous and ophthalmic segments of the internal carotid arteries. 4. Segmental attenuation of distal MCA and PCA branch vessels bilaterally suggesting atherosclerotic change or vasculitis. 5. 6 mm hyperdensity within the right external auditory canal. Recommend direct visualization to evaluate for debris versus cutaneous or subcutaneous lesion.   Electronically Signed   By: Marin Robertshristopher  Mattern M.D.   On: 11/05/2014 10:13   Dg Chest 2 View  11/05/2014   CLINICAL DATA:  Fall, hit head with headache  EXAM: CHEST  2 VIEW  COMPARISON:  None.  FINDINGS: Normal mediastinum and cardiac silhouette. Normal pulmonary vasculature. No evidence of effusion, infiltrate, or pneumothorax. No acute bony abnormality.  IMPRESSION: No acute cardiopulmonary process.   Electronically Signed   By: Genevive BiStewart  Edmunds M.D.   On: 11/05/2014 10:16   Ct Head Wo Contrast  11/05/2014   CLINICAL DATA:  Frontal headache. Fall/syncope. Laceration along the right ear.  EXAM: CT HEAD WITHOUT CONTRAST  TECHNIQUE: Contiguous axial images were obtained from the base of the skull through the vertex without intravenous contrast.  COMPARISON:  None.  FINDINGS: There is abnormal subarachnoid hemorrhage at the foramen magnum and tracking in the medullary cistern, the prepontine cistern, and suprasellar  cistern. There is likely a small amount of hemorrhage in the fourth ventricle inferiorly.  Otherwise, the brainstem, cerebellum, cerebral peduncles, thalamus, basal ganglia, basilar cisterns, and ventricular system appear within normal limits. No mass lesion or acute CVA is identified.  IMPRESSION: 1. Acute subarachnoid hemorrhage in the medullary cistern, pre pontine cistern, and suprasellar cistern. Cannot exclude a small amount of hemorrhage in the fourth ventricle inferiorly. Critical Value/emergent results were called by telephone at the time of interpretation  on 11/05/2014 at 7:50 am to Dr. Charlestine Night , who verbally acknowledged these results.   Electronically Signed   By: Gaylyn Rong M.D.   On: 11/05/2014 07:50   Ct Cervical Spine Wo Contrast  11/05/2014   CLINICAL DATA:  Patient woke up on floor. Does not remember following.  EXAM: CT CERVICAL SPINE WITHOUT CONTRAST  TECHNIQUE: Multidetector CT imaging of the cervical spine was performed without intravenous contrast. Multiplanar CT image reconstructions were also generated.  COMPARISON:  None.  FINDINGS: Normal alignment. Prevertebral soft tissues are normal. Disc spaces are maintained with early anterior spurring. No fracture. No epidural or paraspinal hematoma.  Severe emphysematous changes noted in the lung apices. Complete lucency in the visualize left apex is presumably a large bulla although it is difficult to completely exclude pneumothorax on this limited visualization. May consider chest x-ray to confirm.  IMPRESSION: No acute bony abnormality.  Severe emphysematous changes in the apices. Visualized lucency in the left apex is presumably a large bulla although it is difficult to completely exclude pneumothorax on these limited images. May consider chest x-ray.   Electronically Signed   By: Charlett Nose M.D.   On: 11/05/2014 08:47    The patient be admitted to the hospital.  I spoke with neurosurgery and the [facility] about the patient.  The neurosurgeon has come down to evaluate the patient   MDM   Final diagnoses:  None        Charlestine Night, PA-C 11/06/14 1556  Richardean Canal, MD 11/06/14 [phone]

## 2014-11-05 NOTE — ED Notes (Signed)
Pt arrives with c/o headache, states he woke up on the floor, doesn't recall losing consciousness, falling, hitting head, etc. Admits to ETOH intake. Minor laceration to R ear.

## 2014-11-06 ENCOUNTER — Inpatient Hospital Stay (HOSPITAL_COMMUNITY): Payer: BLUE CROSS/BLUE SHIELD

## 2014-11-06 DIAGNOSIS — F1010 Alcohol abuse, uncomplicated: Secondary | ICD-10-CM

## 2014-11-06 MED ORDER — HYDRALAZINE HCL 20 MG/ML IJ SOLN
5.0000 mg | Freq: Four times a day (QID) | INTRAMUSCULAR | Status: DC | PRN
  Administered 2014-11-06: 5 mg via INTRAVENOUS
  Filled 2014-11-06: qty 1

## 2014-11-06 MED ORDER — HYDRALAZINE HCL 20 MG/ML IJ SOLN
5.0000 mg | Freq: Once | INTRAMUSCULAR | Status: AC
  Administered 2014-11-06: 5 mg via INTRAVENOUS

## 2014-11-06 MED ORDER — AMLODIPINE BESYLATE 5 MG PO TABS: 5.0000 mg | ORAL_TABLET | Freq: Every day | ORAL | Status: DC

## 2014-11-06 MED ORDER — ACETAMINOPHEN 325 MG PO TABS: 650.0000 mg | ORAL_TABLET | Freq: Four times a day (QID) | ORAL | Status: DC | PRN

## 2014-11-06 MED ORDER — HYDRALAZINE HCL 20 MG/ML IJ SOLN: 5.0000 mg | INTRAMUSCULAR | Status: DC | PRN

## 2014-11-06 MED ORDER — AMLODIPINE BESYLATE 5 MG PO TABS
5.0000 mg | ORAL_TABLET | Freq: Every day | ORAL | Status: DC
  Administered 2014-11-06: 5 mg via ORAL
  Filled 2014-11-06: qty 1

## 2014-11-06 NOTE — Progress Notes (Signed)
Subjective: George Andrews feels great this morning. 4/10 headache is not bothering him much. He is curious when he can go home. When asked about his PCP, he states that he has been going to Urgent Care. He once saw a doctor in [place], but cannot recall his name.  Objective: Vital signs in last 24 hours: Filed Vitals:   11/05/14 2140 11/06/14 0126 11/06/14 0544 11/06/14 0957  BP: 164/83 168/76 174/76 180/96  Pulse: 91 86 90 95  Temp: 98.1 F (36.7 C) 98.2 F (36.8 C) 98.3 F (36.8 C) 98 F (36.7 C)  TempSrc: Oral Oral Oral Oral  Resp: _0 Height: _1  (1.778 m)     Weight: 146 lb 6.4 oz (66.407 kg)     SpO2: 98% 99% 98% 99%   Weight change:   Intake/Output Summary (Last 24 hours) at 11/06/14 1116 Last data filed at 11/05/14 1138  Gross per 24 hour  Intake      0 ml  Output    900 ml  Net   -900 ml   Physical Exam: Appearance: well-developed, well-nourished man in NAD, cooperative, eager to go home HEENT: AT/[place], no tenderness on back of head, no sign of trauma in occipital area, dried blood on right ear and at entrance of canal, no decreased hearing, no tenderness, PERRL, EOMi Heart: RRR, normal S1S2, no MRG Lungs: CTAB, normal work of breathing Abdomen: BS+, soft, nontender Musculoskeletal: normal range of motion, no edema Neurologic: A&Ox3, strength 5/5 throughout, sensation full throughout, coordination and speech intact, babinskis downgoing I: smell Not tested  II: visual acuity  intact  II: visual fields Full to finger counting  II: pupils Equal, round, reactive to light  III,VII: ptosis None  III,IV,VI: extraocular muscles  Full ROM  V: mastication Normal  V: facial light touch sensation  Normal  V,VII: corneal reflex  Present  VII: facial muscle function - upper  Normal  VII: facial muscle function - lower Normal  VIII: hearing Full and symmetric to finger rub  IX: soft palate elevation  Normal  IX,X: gag reflex Not tested    XI: trapezius strength  5/5  XI: sternocleidomastoid strength 5/5  XI: neck flexion strength  5/5  XII: tongue strength  Normal    Lab Results: Basic Metabolic Panel:  Recent Labs Lab 11/05/14 0808 11/05/14 0838  NA 141 140  K 6.0* 4.1  CL 103 104  CO2  --  25  GLUCOSE 114* 108*  BUN 12 9  CREATININE 1.10 0.76  CALCIUM  --  8.6  MG  --  2.2   Liver Function Tests:  Recent Labs Lab 11/05/14 0838  AST 71*  ALT 72*  ALKPHOS 66  BILITOT 1.1  PROT 8.1  ALBUMIN 4.0   CBC:  Recent Labs Lab 11/05/14 0808 11/05/14 0838  WBC  --  10.5  NEUTROABS  --  8.0*  HGB 17.7* 16.4  HCT 52.0 47.6  MCV  --  93.5  PLT  --  196   CBG:  Recent Labs Lab 11/05/14 0541  GLUCAP 139*   Alcohol Level:  Recent Labs Lab 11/05/14 0800  ETH 256*   Studies/Results: Ct Angio Head W/cm &/or Wo Cm  11/05/2014   CLINICAL DATA:  Fall related to alcohol intoxication last night. Awoke on floor. Laceration to the right ear. Frontal headache. Subarachnoid hemorrhage  EXAM: CT ANGIOGRAPHY HEAD  TECHNIQUE: Multidetector CT imaging of the head was performed using the standard protocol during  bolus administration of intravenous contrast. Multiplanar CT image reconstructions and MIPs were obtained to evaluate the vascular anatomy.  CONTRAST:  77m OMNIPAQUE IOHEXOL 350 MG/ML SOLN  COMPARISON:  CT head without contrast from the same day.  FINDINGS: CT HEAD  Brain: Pre pontine subarachnoid hemorrhage is again noted. There is asymmetric hemorrhage at the left CP angle. No acute infarct is evident.  Calvarium and skull base: A 6 mm hyperdensity is noted in the right external auditory canal at the junction of the membranous an osseous segments. The calvarium and skullbase are otherwise unremarkable.  Paranasal sinuses: Clear  Orbits: Within normal limits  CTA HEAD  Anterior circulation: Atherosclerotic calcifications are present within the ophthalmic segments of the internal carotid arteries  bilaterally. There is some irregularity throughout the cavernous internal carotid arteries. The terminal ICA is normal bilaterally. The A1 and M1 segments are normal. The anterior communicating artery is patent. The MCA bifurcations are within normal limits. There is some attenuation of distal MCA branch vessels bilaterally without a significant proximal stenosis or occlusion.  Posterior circulation: The vertebral arteries are codominant. The right PICA origin is below the foramen magnum. The left PICA origin is visualized and within normal limits. There is no aneurysm. The vertebrobasilar junction is normal. The basilar artery is within normal limits. The posterior cerebral arteries both originate from the basilar tip. There is significant contribution on the right from a posterior communicating artery. There is some attenuation of distal PCA branch vessels similar to the MCA disease.  Venous sinuses: The dural sinuses are patent. The left transverse sinus is dominant.  Anatomic variants: None.  Delayed phase:No postcontrast enhancement is evident. Of note, subarachnoid hemorrhage does not seem to enter the interpeduncular cistern.  IMPRESSION: 1. Pre pontine and left CP angle subarachnoid hemorrhage is stable. 2. No discrete aneurysm or evidence for focal dissection. The pattern is typical of non aneurysmal subarachnoid hemorrhage in the posterior fossa. 3. Mild atherosclerotic changes within the cavernous and ophthalmic segments of the internal carotid arteries. 4. Segmental attenuation of distal MCA and PCA branch vessels bilaterally suggesting atherosclerotic change or vasculitis. 5. 6 mm hyperdensity within the right external auditory canal. Recommend direct visualization to evaluate for debris versus cutaneous or subcutaneous lesion.   Electronically Signed   By: CSan MorelleM.D.   On: 11/05/2014 10:13   Dg Chest 2 View  11/05/2014   CLINICAL DATA:  Fall, hit head with headache  EXAM: CHEST  2 VIEW   COMPARISON:  None.  FINDINGS: Normal mediastinum and cardiac silhouette. Normal pulmonary vasculature. No evidence of effusion, infiltrate, or pneumothorax. No acute bony abnormality.  IMPRESSION: No acute cardiopulmonary process.   Electronically Signed   By: SSuzy BouchardM.D.   On: 11/05/2014 10:16   Ct Head Wo Contrast  11/06/2014   CLINICAL DATA:  Perimesencephalic subarachnoid hemorrhage.  EXAM: CT HEAD WITHOUT CONTRAST  TECHNIQUE: Contiguous axial images were obtained from the base of the skull through the vertex without intravenous contrast.  COMPARISON:  CTA head 11/05/2014 and CT head 11/05/2014  FINDINGS: The pre pontine and perimesencephalic hemorrhages stable compared to the prior study. Along the superior margin the AP diameter may be slightly reduced. Once again, the blood does not enter the interpeduncular notch. The hemorrhages asymmetric on the left within the CP angle in extending to the craniocervical junction. No new hemorrhage or expansion is evident.  The supratentorial brain is unremarkable. No acute infarct, parenchymal hemorrhage, or mass lesion is present. The ventricles  are of normal size.  The paranasal sinuses and mastoid air cells are clear. The calvarium is intact.  IMPRESSION: 1. Stable to slight decrease in size of pre pontine and perimesencephalic hemorrhage extending from the craniocervical junction to the posterior clinoid. 2. No new hemorrhage or expansion. 3. The supratentorial brain is normal.   Electronically Signed   By: San Morelle M.D.   On: 11/06/2014 10:56   Ct Head Wo Contrast  11/05/2014   CLINICAL DATA:  Frontal headache. Fall/syncope. Laceration along the right ear.  EXAM: CT HEAD WITHOUT CONTRAST  TECHNIQUE: Contiguous axial images were obtained from the base of the skull through the vertex without intravenous contrast.  COMPARISON:  None.  FINDINGS: There is abnormal subarachnoid hemorrhage at the foramen magnum and tracking in the medullary  cistern, the prepontine cistern, and suprasellar cistern. There is likely a small amount of hemorrhage in the fourth ventricle inferiorly.  Otherwise, the brainstem, cerebellum, cerebral peduncles, thalamus, basal ganglia, basilar cisterns, and ventricular system appear within normal limits. No mass lesion or acute CVA is identified.  IMPRESSION: 1. Acute subarachnoid hemorrhage in the medullary cistern, pre pontine cistern, and suprasellar cistern. Cannot exclude a small amount of hemorrhage in the fourth ventricle inferiorly. Critical Value/emergent results were called by telephone at the time of interpretation on 11/05/2014 at 7:50 am to Dr. Dalia Heading , who verbally acknowledged these results.   Electronically Signed   By: Van Clines M.D.   On: 11/05/2014 07:50   Ct Cervical Spine Wo Contrast  11/05/2014   CLINICAL DATA:  Patient woke up on floor. Does not remember following.  EXAM: CT CERVICAL SPINE WITHOUT CONTRAST  TECHNIQUE: Multidetector CT imaging of the cervical spine was performed without intravenous contrast. Multiplanar CT image reconstructions were also generated.  COMPARISON:  None.  FINDINGS: Normal alignment. Prevertebral soft tissues are normal. Disc spaces are maintained with early anterior spurring. No fracture. No epidural or paraspinal hematoma.  Severe emphysematous changes noted in the lung apices. Complete lucency in the visualize left apex is presumably a large bulla although it is difficult to completely exclude pneumothorax on this limited visualization. May consider chest x-ray to confirm.  IMPRESSION: No acute bony abnormality.  Severe emphysematous changes in the apices. Visualized lucency in the left apex is presumably a large bulla although it is difficult to completely exclude pneumothorax on these limited images. May consider chest x-ray.   Electronically Signed   By: Rolm Baptise M.D.   On: 11/05/2014 08:47   Medications: I have reviewed the patient's current  medications. Scheduled Meds: . folic acid  1 mg Oral Daily  . multivitamin with minerals  1 tablet Oral Daily  . thiamine  100 mg Oral Daily   Continuous Infusions:  PRN Meds:.acetaminophen **OR** acetaminophen, LORazepam **OR** LORazepam, morphine injection, oxyCODONE-acetaminophen Assessment/Plan: Principal Problem:   Subarachnoid hemorrhage Active Problems:   Alcohol abuse with intoxication   Elevated blood pressure  Mr. Perrelli is a 54 yo man with a history of alcohol abuse who fell, hit his head, developed a headache and was found to have a perimesencephalic SAH. Repeat CT today shows slight decrease in size of hemorrhage and no hydrocephalus.  Subarachnoid hemorrhage: Likely secondary to head trauma with a fall in the setting of alcohol intoxication. Other causes of syncope are not supported by history and physical exam. No signs of meningeal irritation. EKG without abnormalities. Seborrheic hemorrhage is evident on head CT without contrast and CTA with involvement of medullary cistern, prepontine  cistern and suprasellar cistern. No aneurysm on CTA. Cervical CT scan without contrast does not reveal cervical fracture. Chest x-ray without acute cardiopulmonary process, specifically no pneumothorax. Headache reduced today. No epileptic activity or other concerning findings overnight. - BP was kept under 848 systolic - Tylenol PRN for pain - Neurosurgery has signed off; follow up in 1 month  Elevated Blood Pressure: Patient does not have a past medical history of hypertension. Needs PCP. To follow in [facility] once, then he needs to establish PCP. - Start Amlodipine 5 mg daily - IV Hydralazine 5 mg every 4hr prn for SBP of above 160 mmHg; patient has not been getting this  Alcohol Abuse: LFTs show Alk phos 66, albumin 4.0, AST and ALT elevated at 71 and 72, total protein 8.1, total bili 1.1. Patient counseled on alcohol cessation. - CIWA protocol in place  Minor Ear Laceration: No intervention  required. Dried blood on outside of canal.  Code Status: Full code  F/E/N: Regular  VTE Ppx: SCDs. Avoid heparin products in the setting of SAH  Dispo: Disposition is deferred at this time, awaiting improvement of current medical problems.  Anticipated discharge in approximately 0 day(s).   The patient does not have a current PCP (No primary care provider on file.) and does need an [facility] hospital follow-up appointment after discharge (one time).  The patient does not have transportation limitations that hinder transportation to clinic appointments.  .Services Needed at time of discharge: Y = Yes, Blank = No PT:   OT:   RN:   Equipment:   Other:     LOS: 1 day   Karlene Einstein, MD 11/06/2014, 11:16 AM

## 2014-11-06 NOTE — Discharge Instructions (Signed)
Please be sure to fill your amlodipine blood pressure prescription. Take this pill (5 mg) every day to keep your systolic blood pressure (the first number) below 160. This is important given the bleed in your brain.  Please attend your follow up appointments with the internal medicine clinic and the neurosurgery clinic.  You can continue to take tylenol for your headache.

## 2014-11-06 NOTE — Discharge Summary (Signed)
Name: George Andrews MRN: [id number] DOB: 10/31/1960 54 y.o. PCP: No primary care provider on file.  Date of Admission: 11/05/2014  5:31 AM Date of Discharge: 11/06/2014 Attending Physician: Aldine Contes, MD  Discharge Diagnosis: Principal Problem:   Subarachnoid hemorrhage Active Problems:   Alcohol abuse with intoxication   Elevated blood pressure  Discharge Medications:   Medication List    TAKE these medications        acetaminophen 325 MG tablet  Commonly known as:  TYLENOL  Take 2 tablets (650 mg total) by mouth every 6 (six) hours as needed for mild pain (or Fever >/= 101).     amLODipine 5 MG tablet  Commonly known as:  NORVASC  Take 1 tablet (5 mg total) by mouth daily.     CENTRUM ADULTS PO  Take 1 tablet by mouth daily.     terbinafine 1 % cream  Commonly known as:  LAMISIL AT  Apply 1 application topically 2 (two) times daily.        Disposition and follow-up:   George Andrews was discharged from [facility] in Stable condition.  At the hospital follow up visit please address:  1.  Hypertension in Context of SAH: Patient had not been taking anything for hypertension. Hypertensive to 180s in hospital. Goal to keep SBP <160 given SAH. Amlodipine 5 mg PO daily was started in the hospital (for unclear reason). Please recheck BP in clinic and adjust medication as necessary.  SAH: Assess for neurologic status (asymptomatic other than 4/10 headache at discharge).  PCP: Patient wants to find own PCP, can discuss with him whether he wants to follow in clinic or elsewhere.  2.  Labs / imaging needed at time of follow-up: none  3.  Pending labs/ test needing follow-up: none  Follow-up Appointments: Follow-up Information    Follow up with Blain Pais, MD On 11/08/2014.   Specialty:  Internal Medicine   Why:  1:45   Contact information:   [address] [place] [address] [phone]       Follow up with Ophelia Charter,  MD.   Specialty:  Neurosurgery   Why:  Office to call to set up appointment time in ~1 month   Contact information:   [address] [place] [address] [phone]       Discharge Instructions: Please be sure to fill your amlodipine blood pressure prescription. Take this pill (5 mg) every day to keep your systolic blood pressure (the first number) below 160. This is important given the bleed in your brain.  Please attend your follow up appointments with the internal medicine clinic and the neurosurgery clinic.  You can continue to take tylenol for your headache.   Consultations: Treatment Team:  Newman Pies, MD Ashok Pall, MD  Procedures Performed:  Ct Angio Head W/cm &/or Wo Cm  11/05/2014   CLINICAL DATA:  Fall related to alcohol intoxication last night. Awoke on floor. Laceration to the right ear. Frontal headache. Subarachnoid hemorrhage  EXAM: CT ANGIOGRAPHY HEAD  TECHNIQUE: Multidetector CT imaging of the head was performed using the standard protocol during bolus administration of intravenous contrast. Multiplanar CT image reconstructions and MIPs were obtained to evaluate the vascular anatomy.  CONTRAST:  41m OMNIPAQUE IOHEXOL 350 MG/ML SOLN  COMPARISON:  CT head without contrast from the same day.  FINDINGS: CT HEAD  Brain: Pre pontine subarachnoid hemorrhage is again noted. There is asymmetric hemorrhage at the left  CP angle. No acute infarct is evident.  Calvarium and skull base: A 6 mm hyperdensity is noted in the right external auditory canal at the junction of the membranous an osseous segments. The calvarium and skullbase are otherwise unremarkable.  Paranasal sinuses: Clear  Orbits: Within normal limits  CTA HEAD  Anterior circulation: Atherosclerotic calcifications are present within the ophthalmic segments of the internal carotid arteries bilaterally. There is some irregularity throughout the cavernous internal carotid arteries. The terminal ICA is  normal bilaterally. The A1 and M1 segments are normal. The anterior communicating artery is patent. The MCA bifurcations are within normal limits. There is some attenuation of distal MCA branch vessels bilaterally without a significant proximal stenosis or occlusion.  Posterior circulation: The vertebral arteries are codominant. The right PICA origin is below the foramen magnum. The left PICA origin is visualized and within normal limits. There is no aneurysm. The vertebrobasilar junction is normal. The basilar artery is within normal limits. The posterior cerebral arteries both originate from the basilar tip. There is significant contribution on the right from a posterior communicating artery. There is some attenuation of distal PCA branch vessels similar to the MCA disease.  Venous sinuses: The dural sinuses are patent. The left transverse sinus is dominant.  Anatomic variants: None.  Delayed phase:No postcontrast enhancement is evident. Of note, subarachnoid hemorrhage does not seem to enter the interpeduncular cistern.  IMPRESSION: 1. Pre pontine and left CP angle subarachnoid hemorrhage is stable. 2. No discrete aneurysm or evidence for focal dissection. The pattern is typical of non aneurysmal subarachnoid hemorrhage in the posterior fossa. 3. Mild atherosclerotic changes within the cavernous and ophthalmic segments of the internal carotid arteries. 4. Segmental attenuation of distal MCA and PCA branch vessels bilaterally suggesting atherosclerotic change or vasculitis. 5. 6 mm hyperdensity within the right external auditory canal. Recommend direct visualization to evaluate for debris versus cutaneous or subcutaneous lesion.   Electronically Signed   By: San Morelle M.D.   On: 11/05/2014 10:13   Dg Chest 2 View  11/05/2014   CLINICAL DATA:  Fall, hit head with headache  EXAM: CHEST  2 VIEW  COMPARISON:  None.  FINDINGS: Normal mediastinum and cardiac silhouette. Normal pulmonary vasculature. No  evidence of effusion, infiltrate, or pneumothorax. No acute bony abnormality.  IMPRESSION: No acute cardiopulmonary process.   Electronically Signed   By: Suzy Bouchard M.D.   On: 11/05/2014 10:16   Ct Head Wo Contrast  11/06/2014   CLINICAL DATA:  Perimesencephalic subarachnoid hemorrhage.  EXAM: CT HEAD WITHOUT CONTRAST  TECHNIQUE: Contiguous axial images were obtained from the base of the skull through the vertex without intravenous contrast.  COMPARISON:  CTA head 11/05/2014 and CT head 11/05/2014  FINDINGS: The pre pontine and perimesencephalic hemorrhages stable compared to the prior study. Along the superior margin the AP diameter may be slightly reduced. Once again, the blood does not enter the interpeduncular notch. The hemorrhages asymmetric on the left within the CP angle in extending to the craniocervical junction. No new hemorrhage or expansion is evident.  The supratentorial brain is unremarkable. No acute infarct, parenchymal hemorrhage, or mass lesion is present. The ventricles are of normal size.  The paranasal sinuses and mastoid air cells are clear. The calvarium is intact.  IMPRESSION: 1. Stable to slight decrease in size of pre pontine and perimesencephalic hemorrhage extending from the craniocervical junction to the posterior clinoid. 2. No new hemorrhage or expansion. 3. The supratentorial brain is normal.   Electronically Signed  By: San Morelle M.D.   On: 11/06/2014 10:56   Ct Head Wo Contrast  11/05/2014   CLINICAL DATA:  Frontal headache. Fall/syncope. Laceration along the right ear.  EXAM: CT HEAD WITHOUT CONTRAST  TECHNIQUE: Contiguous axial images were obtained from the base of the skull through the vertex without intravenous contrast.  COMPARISON:  None.  FINDINGS: There is abnormal subarachnoid hemorrhage at the foramen magnum and tracking in the medullary cistern, the prepontine cistern, and suprasellar cistern. There is likely a small amount of hemorrhage in the  fourth ventricle inferiorly.  Otherwise, the brainstem, cerebellum, cerebral peduncles, thalamus, basal ganglia, basilar cisterns, and ventricular system appear within normal limits. No mass lesion or acute CVA is identified.  IMPRESSION: 1. Acute subarachnoid hemorrhage in the medullary cistern, pre pontine cistern, and suprasellar cistern. Cannot exclude a small amount of hemorrhage in the fourth ventricle inferiorly. Critical Value/emergent results were called by telephone at the time of interpretation on 11/05/2014 at 7:50 am to Dr. Dalia Heading , who verbally acknowledged these results.   Electronically Signed   By: Van Clines M.D.   On: 11/05/2014 07:50   Ct Cervical Spine Wo Contrast  11/05/2014   CLINICAL DATA:  Patient woke up on floor. Does not remember following.  EXAM: CT CERVICAL SPINE WITHOUT CONTRAST  TECHNIQUE: Multidetector CT imaging of the cervical spine was performed without intravenous contrast. Multiplanar CT image reconstructions were also generated.  COMPARISON:  None.  FINDINGS: Normal alignment. Prevertebral soft tissues are normal. Disc spaces are maintained with early anterior spurring. No fracture. No epidural or paraspinal hematoma.  Severe emphysematous changes noted in the lung apices. Complete lucency in the visualize left apex is presumably a large bulla although it is difficult to completely exclude pneumothorax on this limited visualization. May consider chest x-ray to confirm.  IMPRESSION: No acute bony abnormality.  Severe emphysematous changes in the apices. Visualized lucency in the left apex is presumably a large bulla although it is difficult to completely exclude pneumothorax on these limited images. May consider chest x-ray.   Electronically Signed   By: Rolm Baptise M.D.   On: 11/05/2014 08:47    Admission HPI: This is a 54 year old gentleman without significant past medical history except alcohol abuse who presents with severe headaches. Patient reports  that last night he went to the kitchen to get some drinking water and fell down. He woke up this morning on the floor with severe frontal headache. He denies vomiting, photophobia, fevers or chills. He states that he had been drinking whole day yesterday and he that he consumed 1 pint of Brandy (liquor). He states that he drinks daily, "a few drinks" of liquor. He describes his headache as throbbing, frontal, constant, without relieving or aggravating factors. He denies neck pain or stiffness. No visual or auditory symptoms. No lightheadedness. He is able to ambulate without support. He does have a small cut on his right ear. No past medical history of seizures or similar episodes of passing out. Alcohol level is 256. Head CT scan without contrast in the ED revealed acute subarachnoid hemorrhage in the medullary cistern, pre-pontine cistern and suprasellar cistern. Follow-up CTA of the head was negative for aneurysm. Internal medicine was consulted for admission and neurosurgery will be seeing the patient in consultation.  Hospital Course by problem list: Principal Problem:   Subarachnoid hemorrhage Active Problems:   Alcohol abuse with intoxication   Elevated blood pressure   Mr. Xue is a 54 yo man with  a history of alcohol abuse who fell, hit his head, developed a headache and was found to have a perimesencephalic SAH. Repeat CT showed slight decrease in size of hemorrhage and no hydrocephalus.  Subarachnoid hemorrhage: Evaluated by neurosurgery. Likely secondary to head trauma with a fall in the setting of alcohol intoxication. Other causes of syncope are not supported by history and physical exam. No signs of meningeal irritation. EKG without abnormalities. Seborrheic hemorrhage is evident on head CT without contrast and CTA with involvement of medullary cistern, prepontine cistern and suprasellar cistern. No aneurysm on CTA. Cervical CT scan without contrast does not reveal cervical fracture. Chest  x-ray without acute cardiopulmonary process, specifically no pneumothorax. Headache was resolving at d/c. No epileptic activity or other concerning findings overnight. BP was elevated, but attempts were made to decrease to <004 systolic.   Hypertension: Patient does not have a past medical history of hypertension. Needs PCP. To follow in [facility] once, then he needs to establish PCP. Started Amlodipine 5 mg daily.  Alcohol Abuse: LFTs show Alk phos 66, albumin 4.0, AST and ALT elevated at 71 and 72, total protein 8.1, total bili 1.1. Patient counseled on alcohol cessation.  Minor Ear Laceration: No intervention required. Dried blood on outside of canal.  Discharge Vitals:   BP 164/62 mmHg  Pulse 92  Temp(Src) 98.1 F (36.7 C) (Oral)  Resp 20  Ht 5' 10"  (1.778 m)  Wt 146 lb 6.4 oz (66.407 kg)  BMI 21.01 kg/m2  SpO2 100%  Discharge Labs:  No results found for this or any previous visit (from the past 24 hour(s)).  Signed: Karlene Einstein, MD 11/06/2014, 3:37 PM    Services Ordered on Discharge: none Equipment Ordered on Discharge: none

## 2014-11-06 NOTE — Progress Notes (Signed)
Discharge orders received, pt for discharge home today, D/C instructions and Rx given with verbalized understanding.  Staff brought pt downstairs via wheelchair.

## 2014-11-06 NOTE — Progress Notes (Signed)
Patient ID: George Andrews, male   DOB: 01-Sep-1960, 54 y.o.   MRN: [id number] Subjective:  The patient is alert and pleasant and feels better. He wants to go home.  Objective: Vital signs in last 24 hours: Temp:  [98.1 F (36.7 C)-99.7 F (37.6 C)] 98.3 F (36.8 C) (03/08 0544) Pulse Rate:  [81-107] 90 (03/08 0544) Resp:  [11-23] 18 (03/08 0544) BP: (141-176)/(61-86) 174/76 mmHg (03/08 0544) SpO2:  [95 %-100 %] 98 % (03/08 0544) Weight:  [66.407 kg (146 lb 6.4 oz)] 66.407 kg (146 lb 6.4 oz) (03/07 2140)  Intake/Output from previous day: 03/07 0701 - 03/08 0700 In: -  Out: 900 [Urine:900] Intake/Output this shift:    Physical exam the patient is alert and oriented 3. His pupils are equal. His speech is normal. He is moving all 4 extremities well.  Lab Results:  Recent Labs  11/05/14 0808 11/05/14 0838  WBC  --  10.5  HGB 17.7* 16.4  HCT 52.0 47.6  PLT  --  196   BMET  Recent Labs  11/05/14 0808 11/05/14 0838  NA 141 140  K 6.0* 4.1  CL 103 104  CO2  --  25  GLUCOSE 114* 108*  BUN 12 9  CREATININE 1.10 0.76  CALCIUM  --  8.6    Studies/Results: Ct Angio Head W/cm &/or Wo Cm  11/05/2014   CLINICAL DATA:  Fall related to alcohol intoxication last night. Awoke on floor. Laceration to the right ear. Frontal headache. Subarachnoid hemorrhage  EXAM: CT ANGIOGRAPHY HEAD  TECHNIQUE: Multidetector CT imaging of the head was performed using the standard protocol during bolus administration of intravenous contrast. Multiplanar CT image reconstructions and MIPs were obtained to evaluate the vascular anatomy.  CONTRAST:  50mL OMNIPAQUE IOHEXOL 350 MG/ML SOLN  COMPARISON:  CT head without contrast from the same day.  FINDINGS: CT HEAD  Brain: Pre pontine subarachnoid hemorrhage is again noted. There is asymmetric hemorrhage at the left CP angle. No acute infarct is evident.  Calvarium and skull base: A 6 mm hyperdensity is noted in the right external auditory canal at the junction  of the membranous an osseous segments. The calvarium and skullbase are otherwise unremarkable.  Paranasal sinuses: Clear  Orbits: Within normal limits  CTA HEAD  Anterior circulation: Atherosclerotic calcifications are present within the ophthalmic segments of the internal carotid arteries bilaterally. There is some irregularity throughout the cavernous internal carotid arteries. The terminal ICA is normal bilaterally. The A1 and M1 segments are normal. The anterior communicating artery is patent. The MCA bifurcations are within normal limits. There is some attenuation of distal MCA branch vessels bilaterally without a significant proximal stenosis or occlusion.  Posterior circulation: The vertebral arteries are codominant. The right PICA origin is below the foramen magnum. The left PICA origin is visualized and within normal limits. There is no aneurysm. The vertebrobasilar junction is normal. The basilar artery is within normal limits. The posterior cerebral arteries both originate from the basilar tip. There is significant contribution on the right from a posterior communicating artery. There is some attenuation of distal PCA branch vessels similar to the MCA disease.  Venous sinuses: The dural sinuses are patent. The left transverse sinus is dominant.  Anatomic variants: None.  Delayed phase:No postcontrast enhancement is evident. Of note, subarachnoid hemorrhage does not seem to enter the interpeduncular cistern.  IMPRESSION: 1. Pre pontine and left CP angle subarachnoid hemorrhage is stable. 2. No discrete aneurysm or evidence for focal dissection. The  pattern is typical of non aneurysmal subarachnoid hemorrhage in the posterior fossa. 3. Mild atherosclerotic changes within the cavernous and ophthalmic segments of the internal carotid arteries. 4. Segmental attenuation of distal MCA and PCA branch vessels bilaterally suggesting atherosclerotic change or vasculitis. 5. 6 mm hyperdensity within the right  external auditory canal. Recommend direct visualization to evaluate for debris versus cutaneous or subcutaneous lesion.   Electronically Signed   By: Marin Robertshristopher  Mattern M.D.   On: 11/05/2014 10:13   Dg Chest 2 View  11/05/2014   CLINICAL DATA:  Fall, hit head with headache  EXAM: CHEST  2 VIEW  COMPARISON:  None.  FINDINGS: Normal mediastinum and cardiac silhouette. Normal pulmonary vasculature. No evidence of effusion, infiltrate, or pneumothorax. No acute bony abnormality.  IMPRESSION: No acute cardiopulmonary process.   Electronically Signed   By: Genevive BiStewart  Edmunds M.D.   On: 11/05/2014 10:16   Ct Head Wo Contrast  11/05/2014   CLINICAL DATA:  Frontal headache. Fall/syncope. Laceration along the right ear.  EXAM: CT HEAD WITHOUT CONTRAST  TECHNIQUE: Contiguous axial images were obtained from the base of the skull through the vertex without intravenous contrast.  COMPARISON:  None.  FINDINGS: There is abnormal subarachnoid hemorrhage at the foramen magnum and tracking in the medullary cistern, the prepontine cistern, and suprasellar cistern. There is likely a small amount of hemorrhage in the fourth ventricle inferiorly.  Otherwise, the brainstem, cerebellum, cerebral peduncles, thalamus, basal ganglia, basilar cisterns, and ventricular system appear within normal limits. No mass lesion or acute CVA is identified.  IMPRESSION: 1. Acute subarachnoid hemorrhage in the medullary cistern, pre pontine cistern, and suprasellar cistern. Cannot exclude a small amount of hemorrhage in the fourth ventricle inferiorly. Critical Value/emergent results were called by telephone at the time of interpretation on 11/05/2014 at 7:50 am to Dr. Charlestine NightHRISTOPHER LAWYER , who verbally acknowledged these results.   Electronically Signed   By: Gaylyn RongWalter  Liebkemann M.D.   On: 11/05/2014 07:50   Ct Cervical Spine Wo Contrast  11/05/2014   CLINICAL DATA:  Patient woke up on floor. Does not remember following.  EXAM: CT CERVICAL SPINE WITHOUT  CONTRAST  TECHNIQUE: Multidetector CT imaging of the cervical spine was performed without intravenous contrast. Multiplanar CT image reconstructions were also generated.  COMPARISON:  None.  FINDINGS: Normal alignment. Prevertebral soft tissues are normal. Disc spaces are maintained with early anterior spurring. No fracture. No epidural or paraspinal hematoma.  Severe emphysematous changes noted in the lung apices. Complete lucency in the visualize left apex is presumably a large bulla although it is difficult to completely exclude pneumothorax on this limited visualization. May consider chest x-ray to confirm.  IMPRESSION: No acute bony abnormality.  Severe emphysematous changes in the apices. Visualized lucency in the left apex is presumably a large bulla although it is difficult to completely exclude pneumothorax on these limited images. May consider chest x-ray.   Electronically Signed   By: Charlett NoseKevin  Dover M.D.   On: 11/05/2014 08:47    Assessment/Plan: Perimesencephalic subarachnoid hemorrhage: The patient is doing well clinically. If his follow-up scan is without significant further bleeding or hydrocephalus, he can be discharged to home from my point of view. Please have a follow-up with me in the office in about a month. I have answered all his questions.  LOS: 1 day     Juanita Streight D 11/06/2014, 7:42 AM

## 2014-11-08 ENCOUNTER — Ambulatory Visit (INDEPENDENT_AMBULATORY_CARE_PROVIDER_SITE_OTHER): Payer: BLUE CROSS/BLUE SHIELD | Admitting: Internal Medicine

## 2014-11-08 ENCOUNTER — Encounter: Payer: Self-pay | Admitting: Internal Medicine

## 2014-11-08 VITALS — BP 149/79 | HR 96 | Temp 98.4°F | Ht 69.5 in | Wt 151.4 lb

## 2014-11-08 DIAGNOSIS — I609 Nontraumatic subarachnoid hemorrhage, unspecified: Secondary | ICD-10-CM

## 2014-11-08 DIAGNOSIS — I10 Essential (primary) hypertension: Secondary | ICD-10-CM | POA: Diagnosis not present

## 2014-11-08 DIAGNOSIS — F10129 Alcohol abuse with intoxication, unspecified: Secondary | ICD-10-CM | POA: Diagnosis not present

## 2014-11-08 MED ORDER — AMLODIPINE BESYLATE 10 MG PO TABS: 10.0000 mg | ORAL_TABLET | Freq: Every day | ORAL | Status: DC

## 2014-11-08 NOTE — Patient Instructions (Addendum)
General Instructions: -Start taking amlodipine 10mg  every.  -Follow up with Dr. Fleet ContrasEdwin Avbuere or with our clinic in 4 weeks for blood pressure recheck.      Please bring your medicines with you each time you come to clinic.  Medicines may include prescription medications, over-the-counter medications, herbal remedies, eye drops, vitamins, or other pills.   Progress Toward Treatment Goals:  No flowsheet data found.  Self Care Goals & Plans:  Self Care Goal 11/08/2014  Manage my medications take my medicines as prescribed; bring my medications to every visit; refill my medications on time; follow the sick day instructions if I am sick  Eat healthy foods eat more vegetables; eat fruit for snacks and desserts; eat foods that are low in salt; eat smaller portions  Be physically active find an activity I enjoy    No flowsheet data found.   Care Management & Community Referrals:  No flowsheet data found.

## 2014-11-08 NOTE — Progress Notes (Signed)
   Subjective:    Patient ID: George Andrews, male    DOB: 09/15/1960, 54 y.o.   MRN: [id number]  HPI Mr. George Andrews is a 54 yr old man with PMH of alcohol abuse, elevated BP, subarachnoid hemorrhage presenting for HFU. [facility] with fall due to alcohol withdrawal.  He reports that he still has a mild headache but no more falls.  He has been compliant to Norvasc 5mg  daily.  He denies alcohol use since he left the hospital. He used to drink 2 glasses of Brandy every night for years off and on. He has never been to alcohol abuse counseling, he denies alcohol abuse, states that alcohol is just to "relax" him and he is not addicted to alcohol. Reports that he is also not addicted to sleeping pills.   He has been seeing Dr. Fleet ContrasEdwin Avbuere but states that he has not been able to make an appointment there.    Review of Systems  Constitutional: Negative for fever, chills, diaphoresis, activity change, appetite change and fatigue.  Respiratory: Negative for cough and shortness of breath.   Neurological: Positive for headaches. Negative for dizziness, tremors, speech difficulty, weakness and light-headedness.  Psychiatric/Behavioral: Negative for confusion and agitation.       Objective:   Physical Exam  Constitutional: He is oriented to person, place, and time. He appears well-developed and well-nourished. No distress.  HENT:  Head: Normocephalic and atraumatic.  Cardiovascular: Normal rate.   Pulmonary/Chest: Effort normal. No respiratory distress.  Neurological: He is alert and oriented to person, place, and time. Coordination normal.  No asterixis, no tremors   Skin: Skin is warm and dry. He is not diaphoretic.  Psychiatric: He has a normal mood and affect.  Nursing note and vitals reviewed.         Assessment & Plan:

## 2014-11-08 NOTE — Assessment & Plan Note (Signed)
BP Readings from Last 3 Encounters:  11/08/14 149/79  11/06/14 164/62  01/18/14 130/72    Lab Results  Component Value Date   NA 140 11/05/2014   K 4.1 11/05/2014   CREATININE 0.76 11/05/2014    Assessment: Blood pressure control:  not controlled Progress toward BP goal:   not at goal Comments: Reports compliance with amlodipine 5mg  daily with no dizziness.   Plan: Medications:  increase amlodipine to 10mg  daily Educational resources provided:   Self management tools provided:   Other plans: Follow up in 4 months.

## 2014-11-08 NOTE — Assessment & Plan Note (Addendum)
He still has mild occipital headache when he lies down but this is improving. Reports no weakness, confusion, changes in speech, or vision changes.  He will see Dr. Lovell SheehanJenkins next week.

## 2014-11-08 NOTE — Assessment & Plan Note (Signed)
He denies alcohol abuse. Reports drinking 2 glasses of Brandy every night for years off and on. Denies need for help with alcohol abuse. Denies falls since his discharge. No tremors or asterixis on physical exam.  Continue monitoring.

## 2014-11-09 NOTE — Progress Notes (Signed)
Medicine attending: Medical history, presenting problems, physical findings, and medications, reviewed with Dr Soli Kennerly on the day of the patient visit and I concur with her evaluation and management plan. 

## 2014-11-10 ENCOUNTER — Emergency Department (HOSPITAL_COMMUNITY)
Admission: EM | Admit: 2014-11-10 | Discharge: 2014-11-10 | Disposition: A | Discharge status: Home / Self Care | Payer: BLUE CROSS/BLUE SHIELD | Attending: Emergency Medicine | Admitting: Emergency Medicine

## 2014-11-10 ENCOUNTER — Encounter (HOSPITAL_COMMUNITY): Payer: Self-pay | Admitting: Family Medicine

## 2014-11-10 ENCOUNTER — Emergency Department (HOSPITAL_COMMUNITY): Payer: BLUE CROSS/BLUE SHIELD

## 2014-11-10 DIAGNOSIS — S01311A Laceration without foreign body of right ear, initial encounter: Secondary | ICD-10-CM | POA: Insufficient documentation

## 2014-11-10 DIAGNOSIS — Y998 Other external cause status: Secondary | ICD-10-CM | POA: Insufficient documentation

## 2014-11-10 DIAGNOSIS — X58XXXA Exposure to other specified factors, initial encounter: Secondary | ICD-10-CM | POA: Insufficient documentation

## 2014-11-10 DIAGNOSIS — M5416 Radiculopathy, lumbar region: Secondary | ICD-10-CM | POA: Diagnosis not present

## 2014-11-10 DIAGNOSIS — M549 Dorsalgia, unspecified: Secondary | ICD-10-CM | POA: Diagnosis present

## 2014-11-10 DIAGNOSIS — Z79899 Other long term (current) drug therapy: Secondary | ICD-10-CM | POA: Insufficient documentation

## 2014-11-10 DIAGNOSIS — Y9389 Activity, other specified: Secondary | ICD-10-CM | POA: Diagnosis not present

## 2014-11-10 DIAGNOSIS — Y9289 Other specified places as the place of occurrence of the external cause: Secondary | ICD-10-CM | POA: Insufficient documentation

## 2014-11-10 DIAGNOSIS — Z720 Tobacco use: Secondary | ICD-10-CM | POA: Diagnosis not present

## 2014-11-10 MED ORDER — PREDNISONE 50 MG PO TABS: 50.0000 mg | ORAL_TABLET | Freq: Every day | ORAL | Status: DC

## 2014-11-10 MED ORDER — HYDROCODONE-ACETAMINOPHEN 5-325 MG PO TABS: 1.0000 | ORAL_TABLET | Freq: Four times a day (QID) | ORAL | Status: DC | PRN

## 2014-11-10 MED ORDER — HYDROMORPHONE HCL 1 MG/ML IJ SOLN
1.0000 mg | Freq: Once | INTRAMUSCULAR | Status: AC
Start: 2014-11-10 — End: 2014-11-10
  Administered 2014-11-10: 1 mg via INTRAMUSCULAR
  Filled 2014-11-10: qty 1

## 2014-11-10 MED ORDER — KETOROLAC TROMETHAMINE 60 MG/2ML IM SOLN
60.0000 mg | Freq: Once | INTRAMUSCULAR | Status: DC
  Filled 2014-11-10: qty 2

## 2014-11-10 NOTE — ED Provider Notes (Signed)
CSN: [id number]     Arrival date & time 11/10/14  1426 History   First MD Initiated Contact with Patient 11/10/14 1929     Chief Complaint  Patient presents with  . Back Pain     (Consider location/radiation/quality/duration/timing/severity/associated sxs/prior Treatment) HPI   The patient is a 54 y/o male with recent history of SAH that presents to the emergency department today for low back pain. He first noticed stiffness on Thursday (11/08/14) walking to his f/u appointment from his hospital admission for his [facility] on 11/05/14. Yesterday, his back became painful and today it has worsened. He states that it is a bilateral, sharp pain that shoots down the back of both thighs. It is worsened with bending and squatting. His back is not tender. He denies recent trauma or exertion as he has been "taking it easy" since he was discharged. He has not tried anything to relieve the pain, but states that the 325 mg acetaminophen that he is taking for his headache has not helped with the back pain. He says the pain is relieved by lying still but never completely goes away. The pain has not woken him up. At its worst, the pain is 10/10. He denies headache (last felt at noon), shortness of breath, chest pain, numbness, tingling, fever, chills, neck pain/stiffness, incontinence.  The patient started amlodipine 10 mg on Wednesday for BP.  History reviewed. No pertinent past medical history. History reviewed. No pertinent past surgical history. History reviewed. No pertinent family history. History  Substance Use Topics  . Smoking status: Current Every Day Smoker -- 0.50 packs/day    Types: Cigarettes  . Smokeless tobacco: Not on file     Comment: Smokes 1/2 PPD  . Alcohol Use: 0.0 oz/week    0 Standard drinks or equivalent per week    Review of Systems  All other systems negative except as documented in the HPI. All pertinent positives and negatives as reviewed in the HPI.   Allergies  Review of  patient's allergies indicates no known allergies.  Home Medications   Prior to Admission medications   Medication Sig Start Date End Date Taking? Authorizing Provider  acetaminophen (TYLENOL) 325 MG tablet Take 2 tablets (650 mg total) by mouth every 6 (six) hours as needed for mild pain (or Fever >/= 101). 11/06/14   Stark Bray, MD  amLODipine (NORVASC) 10 MG tablet Take 1 tablet (10 mg total) by mouth daily. 11/08/14   Ky Barban, MD  Multiple Vitamins-Minerals (CENTRUM ADULTS PO) Take 1 tablet by mouth daily.    Historical Provider, MD  terbinafine (LAMISIL AT) 1 % cream Apply 1 application topically 2 (two) times daily. Patient not taking: Reported on 11/05/2014 01/18/14   Emi Belfast, FNP   BP 157/82 mmHg  Pulse 106  Temp(Src) 98.3 F (36.8 C)  Resp 18  SpO2 100% Physical Exam  Constitutional: He is oriented to person, place, and time. He appears well-developed and well-nourished.  HENT:  Head: Normocephalic.  Right Ear: Right ear exhibits lacerations.  Left Ear: External ear normal.  Eyes: EOM are normal. Pupils are equal, round, and reactive to light.  Neck: Normal range of motion. Neck supple.  Cardiovascular: Normal rate, regular rhythm, normal heart sounds and intact distal pulses.  Exam reveals no gallop and no friction rub.   No murmur heard. Pulmonary/Chest: Effort normal and breath sounds normal. No respiratory distress.  Abdominal: Soft. He exhibits no distension. There is no tenderness.  Musculoskeletal:  Lumbar back: He exhibits pain. He exhibits no tenderness, no swelling and no deformity.       Back:  Positive straight leg raise bilaterally. Pain with internal hip rotation and hip flexion bilaterally. Full ROM w/o pain in knees and ankles.  Lymphadenopathy:    He has no cervical adenopathy.  Neurological: He is alert and oriented to person, place, and time. No sensory deficit. He exhibits normal muscle tone. Coordination normal.  Skin: Skin  is warm and dry.    ED Course  Procedures (including critical care time) Labs Review Labs Reviewed - No data to display  Imaging Review Dg Lumbar Spine Complete  11/10/2014   CLINICAL DATA:  Acute onset of lower back pain for 2 days. Pain radiates down both legs. Initial encounter.  EXAM: LUMBAR SPINE - COMPLETE 4+ VIEW  COMPARISON:  None.  FINDINGS: There is no evidence of fracture or subluxation. Vertebral bodies demonstrate normal height and alignment. Intervertebral disc spaces are preserved. The visualized neural foramina are grossly unremarkable in appearance.  The visualized bowel gas pattern is unremarkable in appearance; air and stool are noted within the colon. The sacroiliac joints are within normal limits.  IMPRESSION: No evidence of fracture or subluxation along the lumbar spine.   Electronically Signed   By: Roanna RaiderJeffery  Chang M.D.   On: 11/10/2014 20:52   Patient has what appears to be a lumbar radiculopathy.  He is told to return here as needed.  Also given follow-up with the [facility] and his primary care Dr. told to use ice and heat on his lower back.  The patient does not have any concerning symptoms for cauda equina or significant neurological impairment     Charlestine NightChristopher Lova Urbieta, PA-C 11/12/14 0205  Jerelyn ScottMartha Linker, MD 11/13/14 [phone]

## 2014-11-10 NOTE — ED Notes (Signed)
Called pt in lobby for vital recheck, no answer.

## 2014-11-10 NOTE — Discharge Instructions (Signed)
Return here as needed. Follow up with your doctor. Use ice and heat on your back °

## 2014-11-10 NOTE — ED Notes (Signed)
Pt here for lower back pain radiating into legs. sts discharged Tuesday from the floor. Was here for subarachnoid bleed.

## 2014-11-15 ENCOUNTER — Ambulatory Visit (INDEPENDENT_AMBULATORY_CARE_PROVIDER_SITE_OTHER): Payer: BLUE CROSS/BLUE SHIELD | Admitting: Internal Medicine

## 2014-11-15 ENCOUNTER — Encounter: Payer: Self-pay | Admitting: Internal Medicine

## 2014-11-15 VITALS — BP 132/61 | HR 82 | Temp 98.2°F | Ht 69.0 in | Wt 152.7 lb

## 2014-11-15 DIAGNOSIS — I10 Essential (primary) hypertension: Secondary | ICD-10-CM

## 2014-11-15 DIAGNOSIS — Z0000 Encounter for general adult medical examination without abnormal findings: Secondary | ICD-10-CM

## 2014-11-15 DIAGNOSIS — M5430 Sciatica, unspecified side: Secondary | ICD-10-CM | POA: Diagnosis not present

## 2014-11-15 MED ORDER — AMLODIPINE BESYLATE 10 MG PO TABS: 10.0000 mg | ORAL_TABLET | Freq: Every day | ORAL | Status: DC

## 2014-11-15 NOTE — Assessment & Plan Note (Signed)
George Andrews agreeable to GI referral for colonoscopy. He deferred any vaccines today.

## 2014-11-15 NOTE — Progress Notes (Signed)
   Subjective:    Patient ID: George Andrews, male    DOB: [birth year], 54 y.o.   MRN: [id number]  HPI  Mr George Andrews is a 54 year old man with history of SAH 2/2 fall while intoxicated, HTN, alcohol abuse here for ED follow-up on back pain.   He was seen in the ED on 3/12 for lower back pain that is worse with bending or squatting with associated sharp pain down his legs bilaterally. He had a lumbar spine x-ray that was unremarkable. He was given norco 5-325 q6hprn #20 in the ED.   Since he left the ED, he says he feels better. He says now he does not have much pain in his actual back but just shooting pain on the back of his legs below his buttocks. He has taken 11 of the 20 vicodin pills since he left. He drove for the first time yesterday. He has not yet gone back to work. He has been exercising with kinesthetics and walks around his yard to try to stay active. He has not had any alcoholic drinks since his admission.  Review of Systems  Constitutional: Negative for fever, chills and diaphoresis.  Respiratory: Negative for shortness of breath.   Cardiovascular: Negative for chest pain.  Gastrointestinal: Negative for nausea, vomiting, abdominal pain, diarrhea and constipation.  Musculoskeletal: Positive for back pain.  Neurological: Negative for weakness, light-headedness, numbness and headaches.       Objective:   Physical Exam  Constitutional: He is oriented to person, place, and time. He appears well-developed and well-nourished. No distress.  HENT:  Head: Normocephalic and atraumatic.  Mouth/Throat: Oropharynx is clear and moist.  Cardiovascular: Normal rate, regular rhythm, normal heart sounds and intact distal pulses.  Exam reveals no gallop and no friction rub.   No murmur heard. Pulmonary/Chest: Effort normal and breath sounds normal. No respiratory distress. He has no wheezes.  Abdominal: Soft. Bowel sounds are normal. He exhibits no distension. There is no tenderness.    Musculoskeletal:  No focal spinal tenderness, full ROM, positive straight leg raise on R, not L  Neurological: He is alert and oriented to person, place, and time. No cranial nerve deficit. He exhibits normal muscle tone. Coordination normal.  Skin: He is not diaphoretic.  Vitals reviewed.         Assessment & Plan:

## 2014-11-15 NOTE — Patient Instructions (Signed)
It was a pleasure to see you today. You may complete your vicodin every six hours. After, take tylenol 325 mg every six hours as needed. You may take ibuprofen 200 mg every six hours but if you are using this consistently call us and we will see you. We have given you a referral for colonoscopy. Please return to clinic or seek medical attention if you have any new or worsening back pain, numbness, weakness, or other worrisome medical condition. We look forward to seeing you again in one month.  Farley LyAdam Tamyra Fojtik, MD  General Instructions:   Please try to bring all your medicines next time. This will help us keep you safe from mistakes.   Progress Toward Treatment Goals:  Treatment Goal 11/15/2014  Blood pressure at goal    Self Care Goals & Plans:  Self Care Goal 11/15/2014  Manage my medications take my medicines as prescribed; bring my medications to every visit; refill my medications on time  Eat healthy foods eat more vegetables; eat foods that are low in salt; eat baked foods instead of fried foods  Be physically active take a walk every day    No flowsheet data found.   Care Management & Community Referrals:  No flowsheet data found.

## 2014-11-15 NOTE — Progress Notes (Signed)
Internal Medicine Clinic Attending  Case discussed with Dr. Rothman at the time of the visit.  We reviewed the resident's history and exam and pertinent patient test results.  I agree with the assessment, diagnosis, and plan of care documented in the resident's note. 

## 2014-11-15 NOTE — Assessment & Plan Note (Signed)
BP Readings from Last 3 Encounters:  11/15/14 132/61  11/10/14 126/54  11/08/14 149/79    Lab Results  Component Value Date   NA 140 11/05/2014   K 4.1 11/05/2014   CREATININE 0.76 11/05/2014    Assessment: Blood pressure control: controlled Progress toward BP goal:  at goal Comments: Controlled on amlodipine 10 mg daily (increased from 5 mg daily at last visit)  Plan: Medications:  continue current medications Educational resources provided:   Self management tools provided:   Other plans: none

## 2014-11-15 NOTE — Assessment & Plan Note (Signed)
George Andrews was seen 3/12 in the ED for lower back pain with bilateral sciatica. L spine x-ray negative. He was given norco and prednisone 50 mg daily. He says that his symptoms have improved greatly since he was seen. He stood up during our interview and started squatting to show me he was feeling better. Exam notable for positive R leg raise but otherwise no abnormalities. He was instructed he may complete the norco prescription as needed. Prednisone was stopped. He was told that he should use OTC tylenol as instructed. He was told he can occasionally use NSAID but to avoid due to risk of gastritis in alcohol abuse (has has not drank since hospitalization). He is to return in one month but given return precautions to be seen sooner.

## 2014-11-20 ENCOUNTER — Ambulatory Visit: Payer: BLUE CROSS/BLUE SHIELD | Admitting: Internal Medicine

## 2014-11-30 ENCOUNTER — Telehealth: Payer: Self-pay | Admitting: *Deleted

## 2014-11-30 NOTE — Telephone Encounter (Signed)
Pt calls and states his BP is 117/ 74, he is concerned and wants to know if he should continue taking his bp medicine, he denies weakness, dizziness. He states he feels pretty good all in all. He would like to know what you think is too low of bp for him? Could you please call him at [phone]

## 2014-11-30 NOTE — Telephone Encounter (Signed)
Can you please call the patient and tell him that if he is asymptomatic that is a good BP for him? We are pretty busy on the in-patient service?  Thanks, Madelaine BhatAdam

## 2014-12-04 NOTE — Telephone Encounter (Signed)
Spoke with pt to convey dr Earma Readingrothman's message, pt states he is not going to see the neurosurgeon and pcp during the same month and he doesn't get why he needs to see both anyway, i tried to explain why and appt was changed for June with dr Valentino Saxonrothman.

## 2014-12-26 ENCOUNTER — Encounter: Payer: BLUE CROSS/BLUE SHIELD | Admitting: Internal Medicine

## 2015-02-05 ENCOUNTER — Telehealth: Payer: Self-pay | Admitting: Internal Medicine

## 2015-02-05 NOTE — Telephone Encounter (Signed)
Call to patient to confirm appointment for 02/06/15 at 1:45 lmtcb

## 2015-02-06 ENCOUNTER — Encounter: Payer: Self-pay | Admitting: Internal Medicine

## 2015-02-06 ENCOUNTER — Ambulatory Visit (INDEPENDENT_AMBULATORY_CARE_PROVIDER_SITE_OTHER): Payer: BLUE CROSS/BLUE SHIELD | Admitting: Internal Medicine

## 2015-02-06 VITALS — BP 153/74 | HR 94 | Temp 98.2°F | Ht 69.0 in | Wt 153.4 lb

## 2015-02-06 DIAGNOSIS — Z8739 Personal history of other diseases of the musculoskeletal system and connective tissue: Secondary | ICD-10-CM | POA: Diagnosis not present

## 2015-02-06 DIAGNOSIS — F17200 Nicotine dependence, unspecified, uncomplicated: Secondary | ICD-10-CM | POA: Diagnosis not present

## 2015-02-06 DIAGNOSIS — F1010 Alcohol abuse, uncomplicated: Secondary | ICD-10-CM | POA: Diagnosis not present

## 2015-02-06 DIAGNOSIS — F10129 Alcohol abuse with intoxication, unspecified: Secondary | ICD-10-CM

## 2015-02-06 DIAGNOSIS — Z0000 Encounter for general adult medical examination without abnormal findings: Secondary | ICD-10-CM

## 2015-02-06 DIAGNOSIS — Z720 Tobacco use: Secondary | ICD-10-CM

## 2015-02-06 DIAGNOSIS — I10 Essential (primary) hypertension: Secondary | ICD-10-CM

## 2015-02-06 DIAGNOSIS — M5430 Sciatica, unspecified side: Secondary | ICD-10-CM

## 2015-02-06 NOTE — Assessment & Plan Note (Signed)
  Assessment: Progress toward smoking cessation:  smoking the same amount Barriers to progress toward smoking cessation:    Comments: He says he smokes about 1/2 ppd which is unchanged. He is not interested in quitting. Says he once quit cold [place] for about five years and could do it again if he wanted.  Plan: Instruction/counseling given:  I counseled patient on the dangers of tobacco use, advised patient to stop smoking, and reviewed strategies to maximize success. Educational resources provided:    Self management tools provided:    Medications to assist with smoking cessation:  None Patient agreed to the following self-care plans for smoking cessation:    Other plans: continue to assess readiness to quit.

## 2015-02-06 NOTE — Assessment & Plan Note (Signed)
He says he is only drinking occasionally on the weekends and this has not been an issue.

## 2015-02-06 NOTE — Progress Notes (Signed)
   Subjective:    Patient ID: George Andrews, male    DOB: 1961-01-27, 54 y.o.   MRN: [id number]  HPI  Mr George Andrews is a 54 year old man with HTN, sciatica, h/o SAH secondary to a fall while intoxicated, alcohol abuse, tobacco abuse here for routine follow-up on his chronic co-morbidities. Please see problem-based charting assessment and plan note for further details of medical issues addressed at today's visit.  Review of Systems  Constitutional: Negative for fever, chills and diaphoresis.  Respiratory: Negative for cough and shortness of breath.   Cardiovascular: Negative for chest pain.  Gastrointestinal: Negative for nausea, vomiting, abdominal pain, diarrhea and constipation.  Neurological: Negative for weakness, light-headedness, numbness and headaches.       Objective:   Physical Exam  Constitutional: He is oriented to person, place, and time. He appears well-developed and well-nourished. No distress.  HENT:  Head: Normocephalic and atraumatic.  Mouth/Throat: Oropharynx is clear and moist.  Eyes: EOM are normal. Pupils are equal, round, and reactive to light.  Cardiovascular: Normal rate, regular rhythm, normal heart sounds and intact distal pulses.  Exam reveals no gallop and no friction rub.   No murmur heard. Pulmonary/Chest: Effort normal and breath sounds normal. No respiratory distress. He has no wheezes.  Abdominal: Soft. Bowel sounds are normal. He exhibits no distension. There is no tenderness.  Musculoskeletal: He exhibits no edema.  Neurological: He is alert and oriented to person, place, and time.  Skin: He is not diaphoretic.  Vitals reviewed.         Assessment & Plan:

## 2015-02-06 NOTE — Patient Instructions (Signed)
It was a pleasure to see you today. Let us know if you ever decide to quit smoking! Please return to clinic or seek medical attention if you have any new or worsening trouble breathing, chest pain, or other worrisome medical condition. We look forward to seeing you again in 6 months.  Farley LyAdam Tico Crotteau, MD  General Instructions:   Please try to bring all your medicines next time. This will help us keep you safe from mistakes.   Progress Toward Treatment Goals:  Treatment Goal 02/06/2015  Blood pressure deteriorated  Stop smoking smoking the same amount    Self Care Goals & Plans:  Self Care Goal 02/06/2015  Manage my medications take my medicines as prescribed; bring my medications to every visit; refill my medications on time  Eat healthy foods eat more vegetables; eat foods that are low in salt; eat baked foods instead of fried foods  Be physically active take a walk every day    No flowsheet data found.   Care Management & Community Referrals:  No flowsheet data found.

## 2015-02-06 NOTE — Assessment & Plan Note (Signed)
He says he has not had any back pain in months. Removed narcotic provided by ED from medication list.

## 2015-02-06 NOTE — Assessment & Plan Note (Signed)
BP Readings from Last 3 Encounters:  02/06/15 153/74  11/15/14 132/61  11/10/14 126/54    Lab Results  Component Value Date   NA 140 11/05/2014   K 4.1 11/05/2014   CREATININE 0.76 11/05/2014    Assessment: Blood pressure control: mildly elevated Progress toward BP goal:  deteriorated Comments: George Andrews was started on amlodipine 5 mg during hospitalization in 10/2014. Increased to amlodipine 10 mg daily on hospital follow-up. He checks BP once a week at mother's house and it is generally 110s/70s and asymptomatic. He is asking if this is okay.  Plan: Medications:  continue current medications Educational resources provided:   Self management tools provided:   Other plans: Reassure if he is asymptomatic this is good BP and to continue current medicines. Provided BP log.

## 2015-02-06 NOTE — Assessment & Plan Note (Signed)
Given colonoscopy referral 10/2014. He says he is still thinking about scheduling it and says he will this year.

## 2015-02-07 NOTE — Progress Notes (Signed)
Internal Medicine Clinic Attending  Case discussed with Dr. Rothman at the time of the visit.  We reviewed the resident's history and exam and pertinent patient test results.  I agree with the assessment, diagnosis, and plan of care documented in the resident's note. 

## 2015-04-09 ENCOUNTER — Other Ambulatory Visit: Payer: Self-pay | Admitting: Internal Medicine

## 2015-11-06 ENCOUNTER — Encounter (HOSPITAL_COMMUNITY): Payer: Self-pay | Admitting: Emergency Medicine

## 2015-11-06 DIAGNOSIS — R112 Nausea with vomiting, unspecified: Secondary | ICD-10-CM | POA: Diagnosis present

## 2015-11-06 DIAGNOSIS — Z79899 Other long term (current) drug therapy: Secondary | ICD-10-CM | POA: Insufficient documentation

## 2015-11-06 DIAGNOSIS — F17210 Nicotine dependence, cigarettes, uncomplicated: Secondary | ICD-10-CM | POA: Insufficient documentation

## 2015-11-06 DIAGNOSIS — I10 Essential (primary) hypertension: Secondary | ICD-10-CM | POA: Diagnosis not present

## 2015-11-06 LAB — CBC
HCT: 48.6 % (ref 39.0–52.0)
Hemoglobin: 17.4 g/dL — ABNORMAL HIGH (ref 13.0–17.0)
MCH: 33.3 pg (ref 26.0–34.0)
MCHC: 35.8 g/dL (ref 30.0–36.0)
MCV: 92.9 fL (ref 78.0–100.0)
PLATELETS: 177 10*3/uL (ref 150–400)
RBC: 5.23 MIL/uL (ref 4.22–5.81)
RDW: 13.4 % (ref 11.5–15.5)
WBC: 8.9 10*3/uL (ref 4.0–10.5)

## 2015-11-06 LAB — COMPREHENSIVE METABOLIC PANEL
ALBUMIN: 3.7 g/dL (ref 3.5–5.0)
ALT: 151 U/L — AB (ref 17–63)
AST: 537 U/L — AB (ref 15–41)
Alkaline Phosphatase: 87 U/L (ref 38–126)
Anion gap: 14 (ref 5–15)
BUN: 11 mg/dL (ref 6–20)
CHLORIDE: 94 mmol/L — AB (ref 101–111)
CO2: 27 mmol/L (ref 22–32)
CREATININE: 0.98 mg/dL (ref 0.61–1.24)
Calcium: 9.3 mg/dL (ref 8.9–10.3)
GFR calc Af Amer: >60 mL/min (ref >60)
GLUCOSE: 127 mg/dL — AB (ref 65–99)
POTASSIUM: 4.1 mmol/L (ref 3.5–5.1)
SODIUM: 135 mmol/L (ref 135–145)
Total Bilirubin: 1.4 mg/dL — ABNORMAL HIGH (ref 0.3–1.2)
Total Protein: 7.3 g/dL (ref 6.5–8.1)

## 2015-11-06 LAB — URINALYSIS, ROUTINE W REFLEX MICROSCOPIC
BILIRUBIN URINE: NEGATIVE
GLUCOSE, UA: NEGATIVE mg/dL
KETONES UR: NEGATIVE mg/dL
Leukocytes, UA: NEGATIVE
Nitrite: NEGATIVE
PROTEIN: 100 mg/dL — AB
Specific Gravity, Urine: 1.022 (ref 1.005–1.030)
pH: 5 (ref 5.0–8.0)

## 2015-11-06 LAB — URINE MICROSCOPIC-ADD ON

## 2015-11-06 NOTE — ED Notes (Signed)
Pt. reports persistent emesis onset last night with fatigue , denies diarrhea , no fever or chills.

## 2015-11-07 ENCOUNTER — Emergency Department (HOSPITAL_COMMUNITY)
Admission: EM | Admit: 2015-11-07 | Discharge: 2015-11-07 | Disposition: A | Discharge status: Home / Self Care | Payer: BLUE CROSS/BLUE SHIELD | Attending: Emergency Medicine | Admitting: Emergency Medicine

## 2015-11-07 DIAGNOSIS — R112 Nausea with vomiting, unspecified: Secondary | ICD-10-CM

## 2015-11-07 HISTORY — DX: Essential (primary) hypertension: I10

## 2015-11-07 LAB — LIPASE, BLOOD: Lipase: 42 U/L (ref 11–51)

## 2015-11-07 MED ORDER — ONDANSETRON HCL 4 MG/2ML IJ SOLN
4.0000 mg | Freq: Once | INTRAMUSCULAR | Status: AC
  Administered 2015-11-07: 4 mg via INTRAVENOUS
  Filled 2015-11-07: qty 2

## 2015-11-07 MED ORDER — SODIUM CHLORIDE 0.9 % IV BOLUS (SEPSIS)
1000.0000 mL | Freq: Once | INTRAVENOUS | Status: AC
  Administered 2015-11-07: 1000 mL via INTRAVENOUS

## 2015-11-07 MED ORDER — ONDANSETRON HCL 4 MG PO TABS: 4.0000 mg | ORAL_TABLET | Freq: Four times a day (QID) | ORAL | Status: DC

## 2015-11-07 NOTE — ED Notes (Signed)
Pt up ambulating to bathroom

## 2015-11-07 NOTE — ED Provider Notes (Signed)
CSN: [id number]     Arrival date & time 11/06/15  2234 History   By signing my name below, I, Arlan Organ, attest that this documentation has been prepared under the direction and in the presence of Gilda Crease, MD.  Electronically Signed: Arlan Organ, ED Scribe. 11/07/2015. 4:00 AM.   Chief Complaint  Patient presents with  . Emesis   The history is provided by the patient. No language interpreter was used.    HPI Comments: DWON SKY is a 55 y.o. male without any pertinent past medical history who presents to the Emergency Department complaining of intermittent, ongoing, improving vomiting with associated nausea onset last night. Pt states he can keep a small amount of solid foods and liquids down at this time. No aggravating or alleviating factors reported. No OTC medications or home remedies attempted prior to arrival. Last normal BM last night. No recent fever, chills, cough, congestion, abdominal pain, diarrhea, or shortness of breath. No known allergies to medications.  PCP: Valentino Nose, MD    Past Medical History  Diagnosis Date  . Hypertension    History reviewed. No pertinent past surgical history. No family history on file. Social History  Substance Use Topics  . Smoking status: Current Every Day Smoker -- 0.00 packs/day    Types: Cigarettes  . Smokeless tobacco: None     Comment: Smokes 1/2 PPD  . Alcohol Use: 0.0 oz/week    0 Standard drinks or equivalent per week    Review of Systems  Constitutional: Negative for fever and chills.  Respiratory: Negative for cough and shortness of breath.   Cardiovascular: Negative for chest pain.  Gastrointestinal: Positive for nausea and vomiting. Negative for abdominal pain and diarrhea.  Neurological: Negative for headaches.  Psychiatric/Behavioral: Negative for confusion.  All other systems reviewed and are negative.     Allergies  Review of patient's allergies indicates no known allergies.  Home  Medications   Prior to Admission medications   Medication Sig Start Date End Date Taking? Authorizing Provider  acetaminophen (TYLENOL) 325 MG tablet Take 2 tablets (650 mg total) by mouth every 6 (six) hours as needed for mild pain (or Fever >/= 101). Patient not taking: Reported on 11/15/2014 11/06/14   Stark Bray, MD  amLODipine (NORVASC) 10 MG tablet TAKE 1 TABLET BY MOUTH EVERY DAY 04/09/15   Valentino Nose, MD  Multiple Vitamins-Minerals (CENTRUM ADULTS PO) Take 1 tablet by mouth daily.    Historical Provider, MD  terbinafine (LAMISIL AT) 1 % cream Apply 1 application topically 2 (two) times daily. Patient not taking: Reported on 11/05/2014 01/18/14   Emi Belfast, FNP   Triage Vitals: BP 149/91 mmHg  Pulse 112  Temp(Src) 98.6 F (37 C) (Oral)  Resp 18  SpO2 99%   Physical Exam  Constitutional: He is oriented to person, place, and time. He appears well-developed and well-nourished. No distress.  HENT:  Head: Normocephalic and atraumatic.  Right Ear: Hearing normal.  Left Ear: Hearing normal.  Nose: Nose normal.  Mouth/Throat: Oropharynx is clear and moist and mucous membranes are normal.  Eyes: Conjunctivae and EOM are normal. Pupils are equal, round, and reactive to light.  Neck: Normal range of motion. Neck supple.  Cardiovascular: Regular rhythm, S1 normal and S2 normal.  Exam reveals no gallop and no friction rub.   No murmur heard. Pulmonary/Chest: Effort normal and breath sounds normal. No respiratory distress. He exhibits no tenderness.  Abdominal: Soft. Normal appearance and bowel sounds are  normal. There is no hepatosplenomegaly. There is no tenderness. There is no rebound, no guarding, no tenderness at McBurney's point and negative Murphy's sign. No hernia.  Musculoskeletal: Normal range of motion.  Neurological: He is alert and oriented to person, place, and time. He has normal strength. No cranial nerve deficit or sensory deficit. Coordination normal. GCS eye  subscore is 4. GCS verbal subscore is 5. GCS motor subscore is 6.  Skin: Skin is warm, dry and intact. No rash noted. No cyanosis.  Psychiatric: He has a normal mood and affect. His speech is normal and behavior is normal. Thought content normal.  Nursing note and vitals reviewed.   ED Course  Procedures (including critical care time)  DIAGNOSTIC STUDIES: Oxygen Saturation is 99% on RA, Normal by my interpretation.    COORDINATION OF CARE: 3:59 AM- Will order blood work. Will give fluids and Zofran. Discussed treatment plan with pt at bedside and pt agreed to plan.     Labs Review Labs Reviewed  COMPREHENSIVE METABOLIC PANEL - Abnormal; Notable for the following:    Chloride 94 (*)    Glucose, Bld 127 (*)    AST 537 (*)    ALT 151 (*)    Total Bilirubin 1.4 (*)    All other components within normal limits  CBC - Abnormal; Notable for the following:    Hemoglobin 17.4 (*)    All other components within normal limits  URINALYSIS, ROUTINE W REFLEX MICROSCOPIC (NOT AT [facility]) - Abnormal; Notable for the following:    APPearance CLOUDY (*)    Hgb urine dipstick TRACE (*)    Protein, ur 100 (*)    All other components within normal limits  URINE MICROSCOPIC-ADD ON - Abnormal; Notable for the following:    Squamous Epithelial / LPF 0-5 (*)    Bacteria, UA RARE (*)    Casts HYALINE CASTS (*)    All other components within normal limits  LIPASE, BLOOD    Imaging Review No results found. I have personally reviewed and evaluated these images and lab results as part of my medical decision-making.   EKG Interpretation None      MDM   Final diagnoses:  None   nausea and vomiting  Patient presents to the ER for evaluation of nausea and vomiting for 2 days. Patient reports that symptoms have worsened each last 2 nights, causing him to be unable to work. Turner day he is able to eat that much better. Patient denies any abdominal pain associated with the symptoms. His abdominal  exam is benign, nontender here in the ER. Lab work was unremarkable. Patient hydrated, treated with Zofran and continues to do well. Will discharge with continued outpatient symptomatically treatment.  I personally performed the services described in this documentation, which was scribed in my presence. The recorded information has been reviewed and is accurate.    Gilda Creasehristopher J Weslee Prestage, MD 11/07/15 [phone]

## 2015-11-07 NOTE — Discharge Instructions (Signed)

## 2015-11-20 ENCOUNTER — Telehealth: Payer: Self-pay | Admitting: Internal Medicine

## 2015-11-20 NOTE — Telephone Encounter (Signed)
APPT. REMINDER CALL, LMTCB °

## 2015-11-21 ENCOUNTER — Ambulatory Visit (INDEPENDENT_AMBULATORY_CARE_PROVIDER_SITE_OTHER): Payer: BLUE CROSS/BLUE SHIELD | Admitting: Internal Medicine

## 2015-11-21 ENCOUNTER — Encounter: Payer: Self-pay | Admitting: Internal Medicine

## 2015-11-21 VITALS — BP 155/80 | HR 96 | Temp 98.0°F | Ht 69.0 in | Wt 147.7 lb

## 2015-11-21 DIAGNOSIS — G5712 Meralgia paresthetica, left lower limb: Secondary | ICD-10-CM

## 2015-11-21 DIAGNOSIS — I10 Essential (primary) hypertension: Secondary | ICD-10-CM

## 2015-11-21 DIAGNOSIS — R740 Nonspecific elevation of levels of transaminase and lactic acid dehydrogenase [LDH]: Secondary | ICD-10-CM

## 2015-11-21 DIAGNOSIS — R208 Other disturbances of skin sensation: Secondary | ICD-10-CM

## 2015-11-21 DIAGNOSIS — IMO0001 Reserved for inherently not codable concepts without codable children: Secondary | ICD-10-CM

## 2015-11-21 DIAGNOSIS — F1020 Alcohol dependence, uncomplicated: Secondary | ICD-10-CM | POA: Diagnosis not present

## 2015-11-21 DIAGNOSIS — R7401 Elevation of levels of liver transaminase levels: Secondary | ICD-10-CM | POA: Insufficient documentation

## 2015-11-21 DIAGNOSIS — G5710 Meralgia paresthetica, unspecified lower limb: Secondary | ICD-10-CM | POA: Insufficient documentation

## 2015-11-21 MED ORDER — AMLODIPINE BESYLATE 5 MG PO TABS: 5.0000 mg | ORAL_TABLET | Freq: Every day | ORAL | Status: DC

## 2015-11-21 NOTE — Assessment & Plan Note (Signed)
Continues to drink 2-3 glasses of Brandy every night and does binge drinking over the weekend, states " I don't count".  Asked him to cut down on the alcohol intake as it is causing liver damage and was also likely causing his nausea.

## 2015-11-21 NOTE — Assessment & Plan Note (Signed)
Filed Vitals:   11/21/15 1312  BP: 155/80  Pulse: 96  Temp: 98 F (36.7 C)   He is not compliant with his amlodipine 10 mg daily due to feeling sluggish. I told him the sluggishness is likely 2/2 to his alcohol use. Asked him to try taking 5mg  amlodipine daily for now and let us know if he has side effects.  F/up in 3 months.

## 2015-11-21 NOTE — Progress Notes (Signed)
   Subjective:    Patient ID: George Andrews, male    DOB: 09-09-1960, 55 y.o.   MRN: [id number]  HPI  55 yo male with hx of HTN, SAH, sciatica, alcohol abuse, here for ED f/up.  Went to ED on 3/9 for Nausea and vomitting. Lab work including CMET showed AST 537, ALT 151 (consistent with alcoholic pattern). Cbc showed high hgb 17.4, lipase normal. Was given zofran and IVF. Now he is feeling better.   He admits to binge drinking over the weekends. When asked to quantify, he said "I don't count". Over the weekdays he drinks 2-3 glasses of Brandy. He denies any ab pain. Stated his nausea improved after 2 days of taking zofran. No complaint right now other than some numbness on his lateral thigh.  He wears tight belt and sometimes carries tools in his pant pocket at work. No weakness or back pain or other symptoms now.   HTN: not taking amlodipine for last several months. Stays it makes him feel sluggish.     Review of Systems  Constitutional: Negative for fever and chills.  HENT: Negative for congestion and sore throat.   Eyes: Negative for photophobia and visual disturbance.  Respiratory: Negative for cough, chest tightness and shortness of breath.   Cardiovascular: Negative for chest pain, palpitations and leg swelling.  Gastrointestinal: Negative for nausea, vomiting, abdominal pain, diarrhea and abdominal distention.  Endocrine: Negative.   Genitourinary: Negative for dysuria and flank pain.  Musculoskeletal: Negative.   Skin: Negative.   Neurological: Negative for dizziness, seizures and numbness.  Hematological: Negative.   Psychiatric/Behavioral: Negative for behavioral problems and agitation.       Objective:   Physical Exam  Constitutional: He is oriented to person, place, and time. He appears well-developed and well-nourished. No distress.  HENT:  Head: Normocephalic and atraumatic.  Mouth/Throat: No oropharyngeal exudate.  Eyes: EOM are normal. Pupils are equal, round,  and reactive to light. Right eye exhibits no discharge. Left eye exhibits no discharge.  Neck: Normal range of motion. No JVD present.  Cardiovascular: Normal rate and regular rhythm.  Exam reveals no gallop and no friction rub.   No murmur heard. Pulmonary/Chest: Effort normal and breath sounds normal. No respiratory distress. He has no wheezes.  Abdominal: Soft. Bowel sounds are normal. He exhibits no distension. There is no tenderness.  Musculoskeletal: Normal range of motion. He exhibits no edema or tenderness.  Neurological: He is alert and oriented to person, place, and time. No cranial nerve deficit. Coordination normal.  Skin: He is not diaphoretic.  Psychiatric: He has a normal mood and affect.    Filed Vitals:   11/21/15 1312  BP: 155/80  Pulse: 96  Temp: 98 F (36.7 C)         Assessment & Plan:  See problem based a&p.

## 2015-11-21 NOTE — Assessment & Plan Note (Signed)
Patient had AST of 537 and ALT 151. This is in the alcohol related hepatitis pattern. He is no longer having any nausea. Normal Bilirubin. Normal abdominal exam.   - will recheck CMET Today. I expect it should trend down. If LFTs trending down then no further workup, if still elevated may consider doing other hepatitis workup. Asked him to cut down on alcohol intake as this may cause his LFTs to be elevated.

## 2015-11-21 NOTE — Patient Instructions (Signed)
Please start taking amlodipine 5mg  daily. If you have side effects please call us before you stop taking your medications.  Please cut down on alcohol. This was likely causing your nausea and your liver damage. If you continue to have liver abnormality then we will need to do further workup.

## 2015-11-21 NOTE — Assessment & Plan Note (Signed)
Having occasional burning sensation on lateral left thigh. Does wear tight belt on the waist along with carrying tools at work in his pant pockets. This is likely 2/2 to lateral femoral cutaenous neuropathy.  Asked him to avoid wearing tight belts and avoid heavy things in his pant pockets.

## 2015-11-22 LAB — COMPREHENSIVE METABOLIC PANEL
ALBUMIN: 4.2 g/dL (ref 3.5–5.5)
ALT: 58 IU/L — ABNORMAL HIGH (ref 0–44)
AST: 60 IU/L — AB (ref 0–40)
Albumin/Globulin Ratio: 1.4 (ref 1.2–2.2)
Alkaline Phosphatase: 75 IU/L (ref 39–117)
BUN / CREAT RATIO: 9 (ref 9–20)
BUN: 7 mg/dL (ref 6–24)
Bilirubin Total: 0.7 mg/dL (ref 0.0–1.2)
CALCIUM: 9 mg/dL (ref 8.7–10.2)
CO2: 23 mmol/L (ref 18–29)
CREATININE: 0.8 mg/dL (ref 0.76–1.27)
Chloride: 95 mmol/L — ABNORMAL LOW (ref 96–106)
GFR, EST AFRICAN AMERICAN: 118 mL/min/{1.73_m2} (ref >59)
GFR, EST NON AFRICAN AMERICAN: 102 mL/min/{1.73_m2} (ref >59)
GLOBULIN, TOTAL: 2.9 g/dL (ref 1.5–4.5)
Glucose: 96 mg/dL (ref 65–99)
Potassium: 4.1 mmol/L (ref 3.5–5.2)
SODIUM: 137 mmol/L (ref 134–144)
TOTAL PROTEIN: 7.1 g/dL (ref 6.0–8.5)

## 2015-11-25 NOTE — Progress Notes (Signed)
[facility] Medicine Clinic Attending  Case discussed with Dr. Ahmed at the time of the visit.  We reviewed the resident's history and exam and pertinent patient test results.  I agree with the assessment, diagnosis, and plan of care documented in the resident's note. 

## 2016-04-18 ENCOUNTER — Ambulatory Visit: Payer: Self-pay

## 2016-06-26 IMAGING — CT CT ANGIO HEAD
2 of 11 series · 10 of 47 positions shown · IV contrast (omnipaque)
Comparison: CT head without contrast from the same day.

CLINICAL DATA: Fall related to alcohol intoxication last night.
Awoke on floor. Laceration to the right ear. Frontal headache.
Subarachnoid hemorrhage

EXAM:
CT ANGIOGRAPHY HEAD
TECHNIQUE: Multidetector CT imaging of the head was performed using the
standard protocol during bolus administration of intravenous
contrast. Multiplanar CT image reconstructions and MIPs were
obtained to evaluate the vascular anatomy.
CONTRAST:  50mL OMNIPAQUE IOHEXOL 350 MG/ML SOLN

[Series 5: cta brain 2.0 i30f 3 · axial · 0.47mm/px · z∈[-79,-21]mm · 2 of 87 slices shown]
[im 29/87  brain]
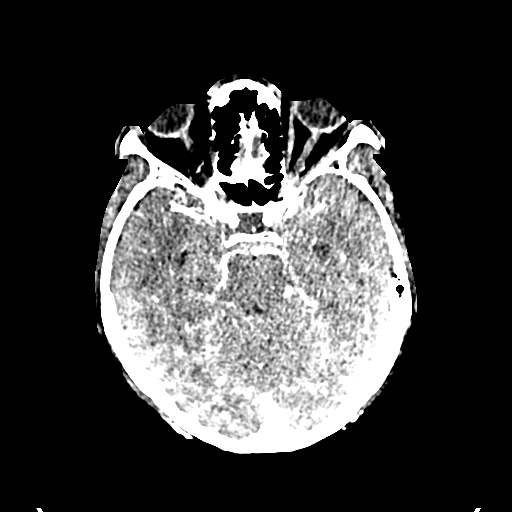
[im 58/87  brain]
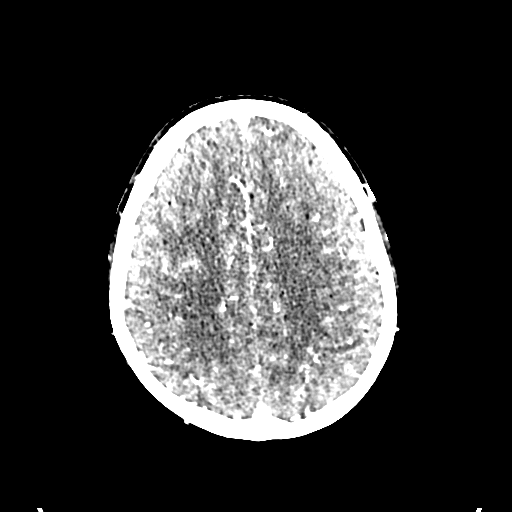

[Series 6: thin axial · axial · 0.47mm/px · z∈[-116,+17]mm · 8 of 173 slices shown]
[im 20/173  brain]
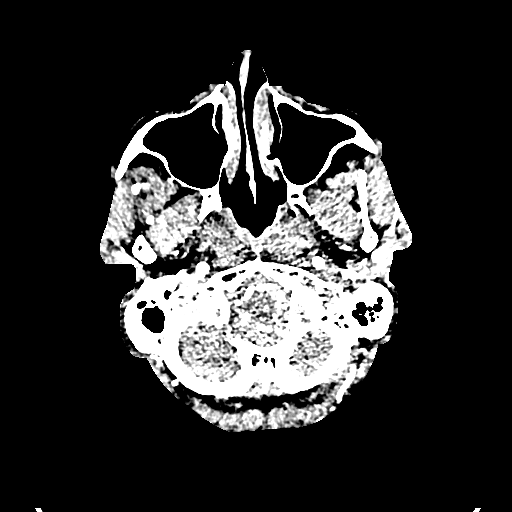
[im 39/173  bone]
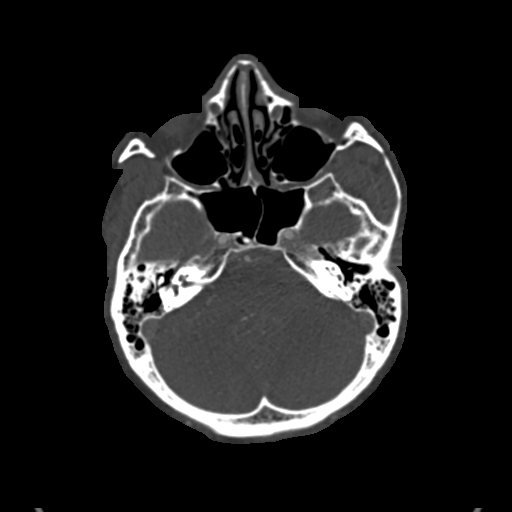
[im 58/173  brain]
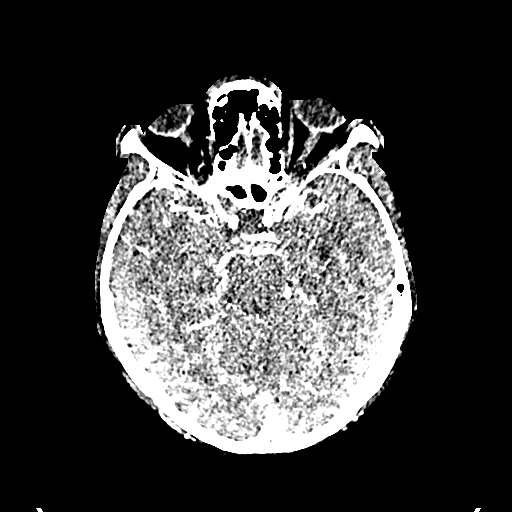
[im 77/173  bone]
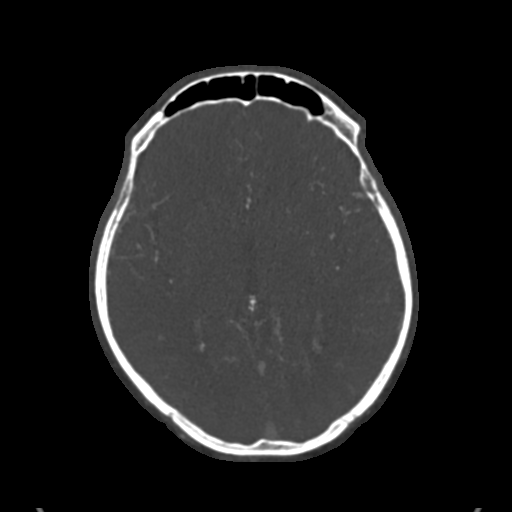
[im 96/173  brain]
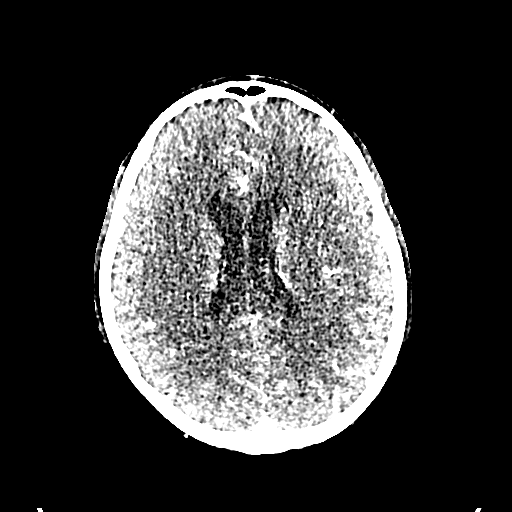
[im 115/173  bone]
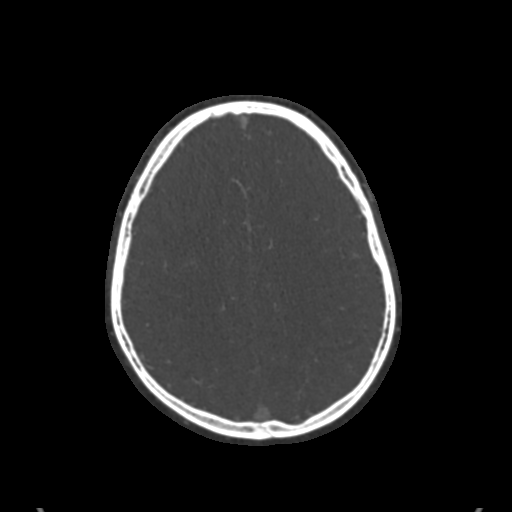
[im 134/173  brain]
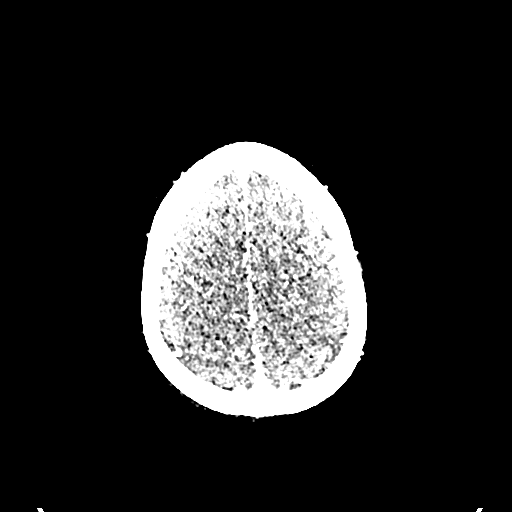
[im 153/173  bone]
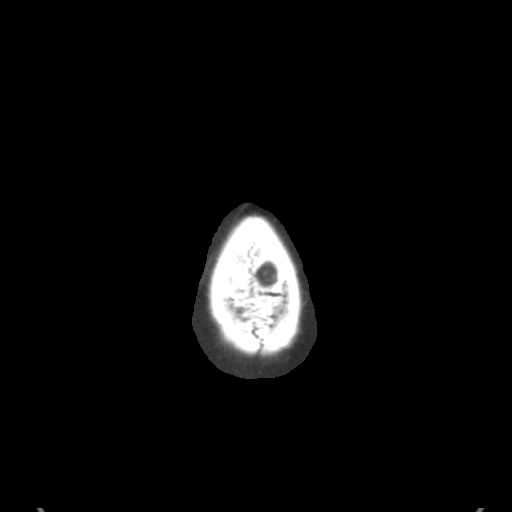

[10 of 47 positions shown; findings below may reference images not displayed]

FINDINGS: CT HEAD

Brain: Pre pontine subarachnoid hemorrhage is again noted. There is
asymmetric hemorrhage at the left CP angle. No acute infarct is
evident.

Calvarium and skull base: A 6 mm hyperdensity is noted in the right
external auditory canal at the junction of the membranous an osseous
segments. The calvarium and skullbase are otherwise unremarkable.

Paranasal sinuses: Clear

Orbits: Within normal limits

CTA HEAD

Anterior circulation: Atherosclerotic calcifications are present
within the ophthalmic segments of the internal carotid arteries
bilaterally. There is some irregularity throughout the cavernous
internal carotid arteries. The terminal ICA is normal bilaterally.
The A1 and M1 segments are normal. The anterior communicating artery
is patent. The MCA bifurcations are within normal limits. There is
some attenuation of distal MCA branch vessels bilaterally without a
significant proximal stenosis or occlusion.

Posterior circulation: The vertebral arteries are codominant. The
right PICA origin is below the foramen magnum. The left PICA origin
is visualized and within normal limits. There is no aneurysm. The
vertebrobasilar junction is normal. The basilar artery is within
normal limits. The posterior cerebral arteries both originate from
the basilar tip. There is significant contribution on the right from
a posterior communicating artery. There is some attenuation of
distal PCA branch vessels similar to the MCA disease.

Venous sinuses: The dural sinuses are patent. The left transverse
sinus is dominant.

Anatomic variants: None.

Delayed phase:No postcontrast enhancement is evident. Of note,
subarachnoid hemorrhage does not seem to enter the interpeduncular
cistern.
IMPRESSION: 1. Pre pontine and left CP angle subarachnoid hemorrhage is stable.
2. No discrete aneurysm or evidence for focal dissection. The
pattern is typical of non aneurysmal subarachnoid hemorrhage in the
posterior fossa.
3. Mild atherosclerotic changes within the cavernous and ophthalmic
segments of the internal carotid arteries.
4. Segmental attenuation of distal MCA and PCA branch vessels
bilaterally suggesting atherosclerotic change or vasculitis.
5. 6 mm hyperdensity within the right external auditory canal.
Recommend direct visualization to evaluate for debris versus
cutaneous or subcutaneous lesion.

## 2016-06-26 IMAGING — CR DG CHEST 2V
2 series · 2 of 2 positions shown · non-contrast
Comparison: None.

CLINICAL DATA: Fall, hit head with headache

EXAM:
CHEST  2 VIEW

[chest lat]
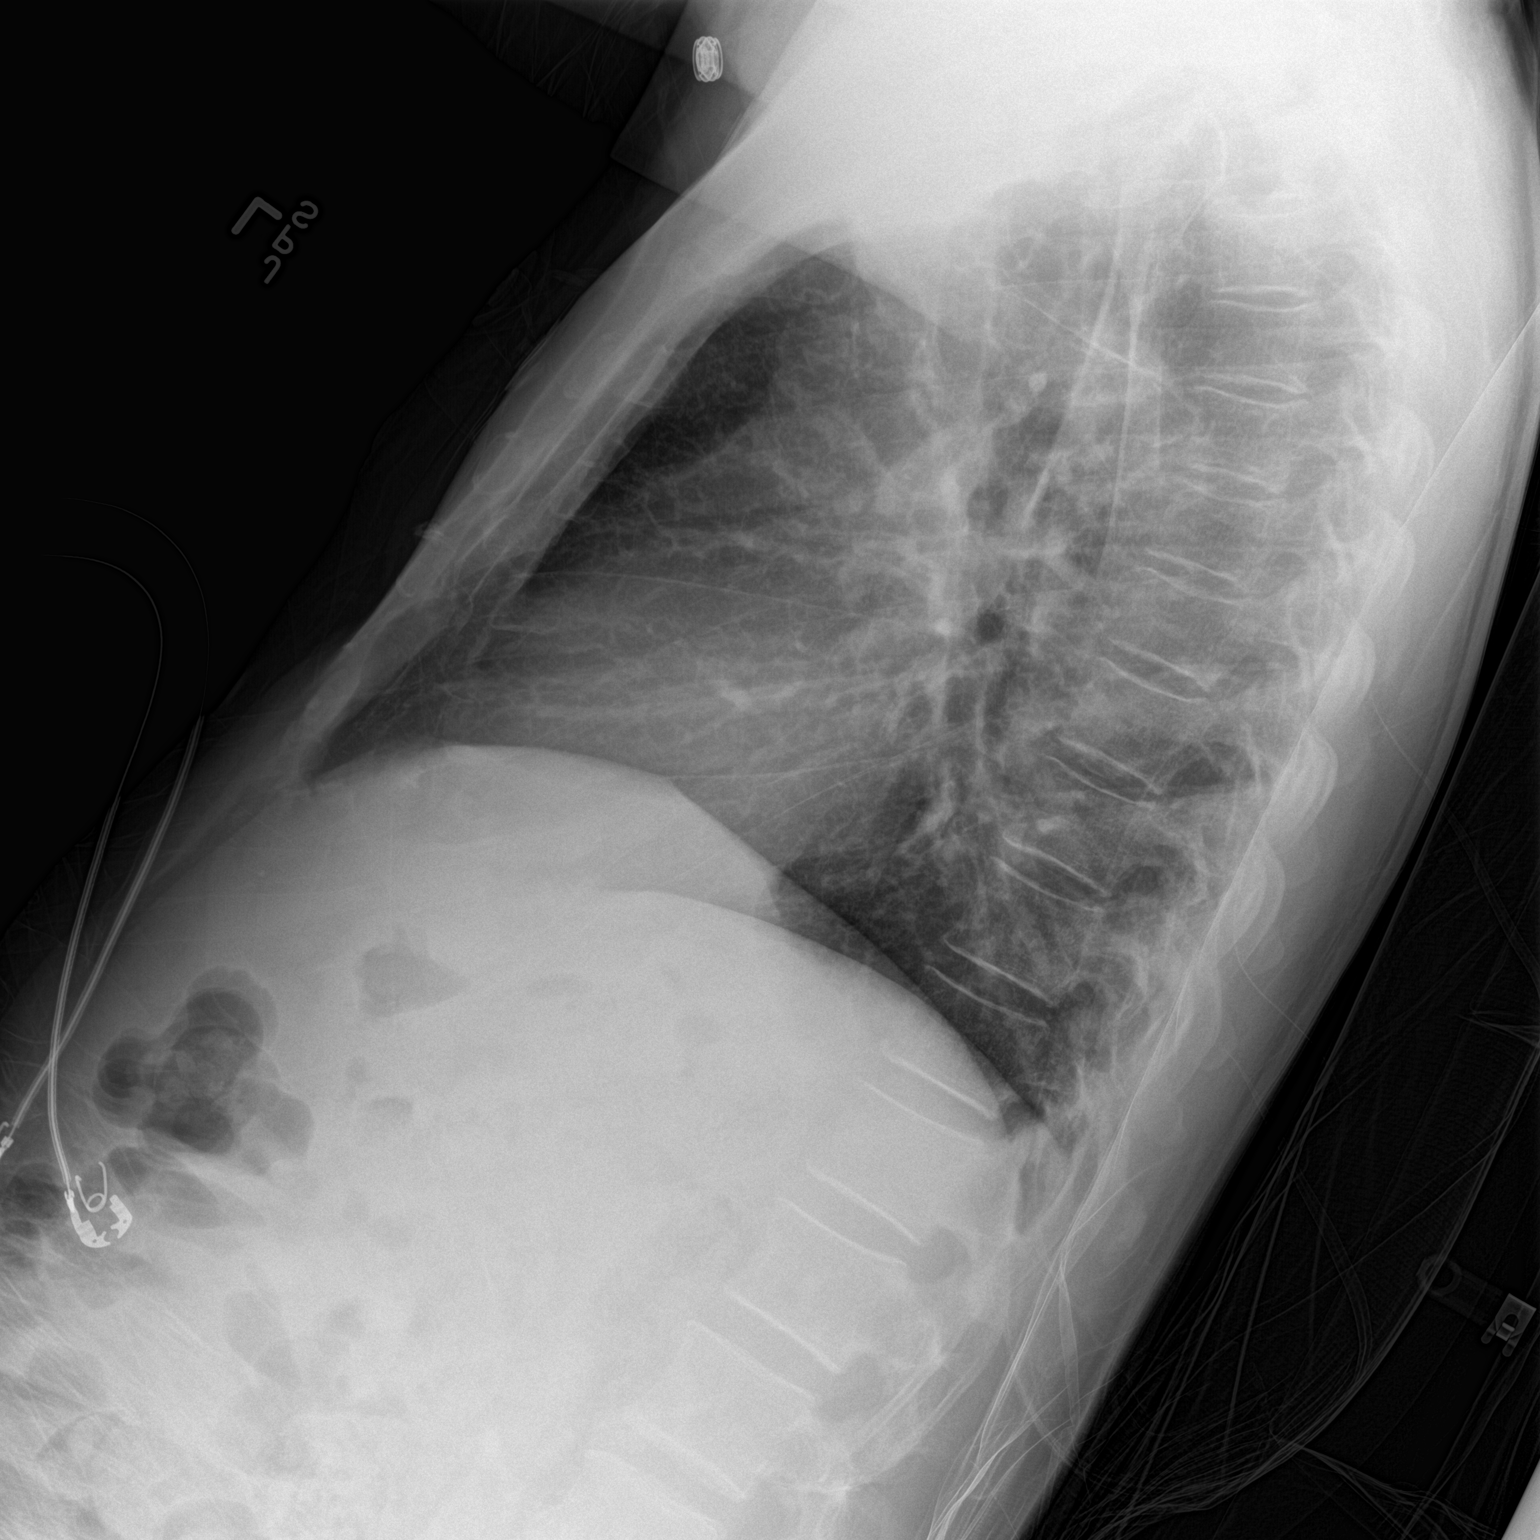

[chest ap]
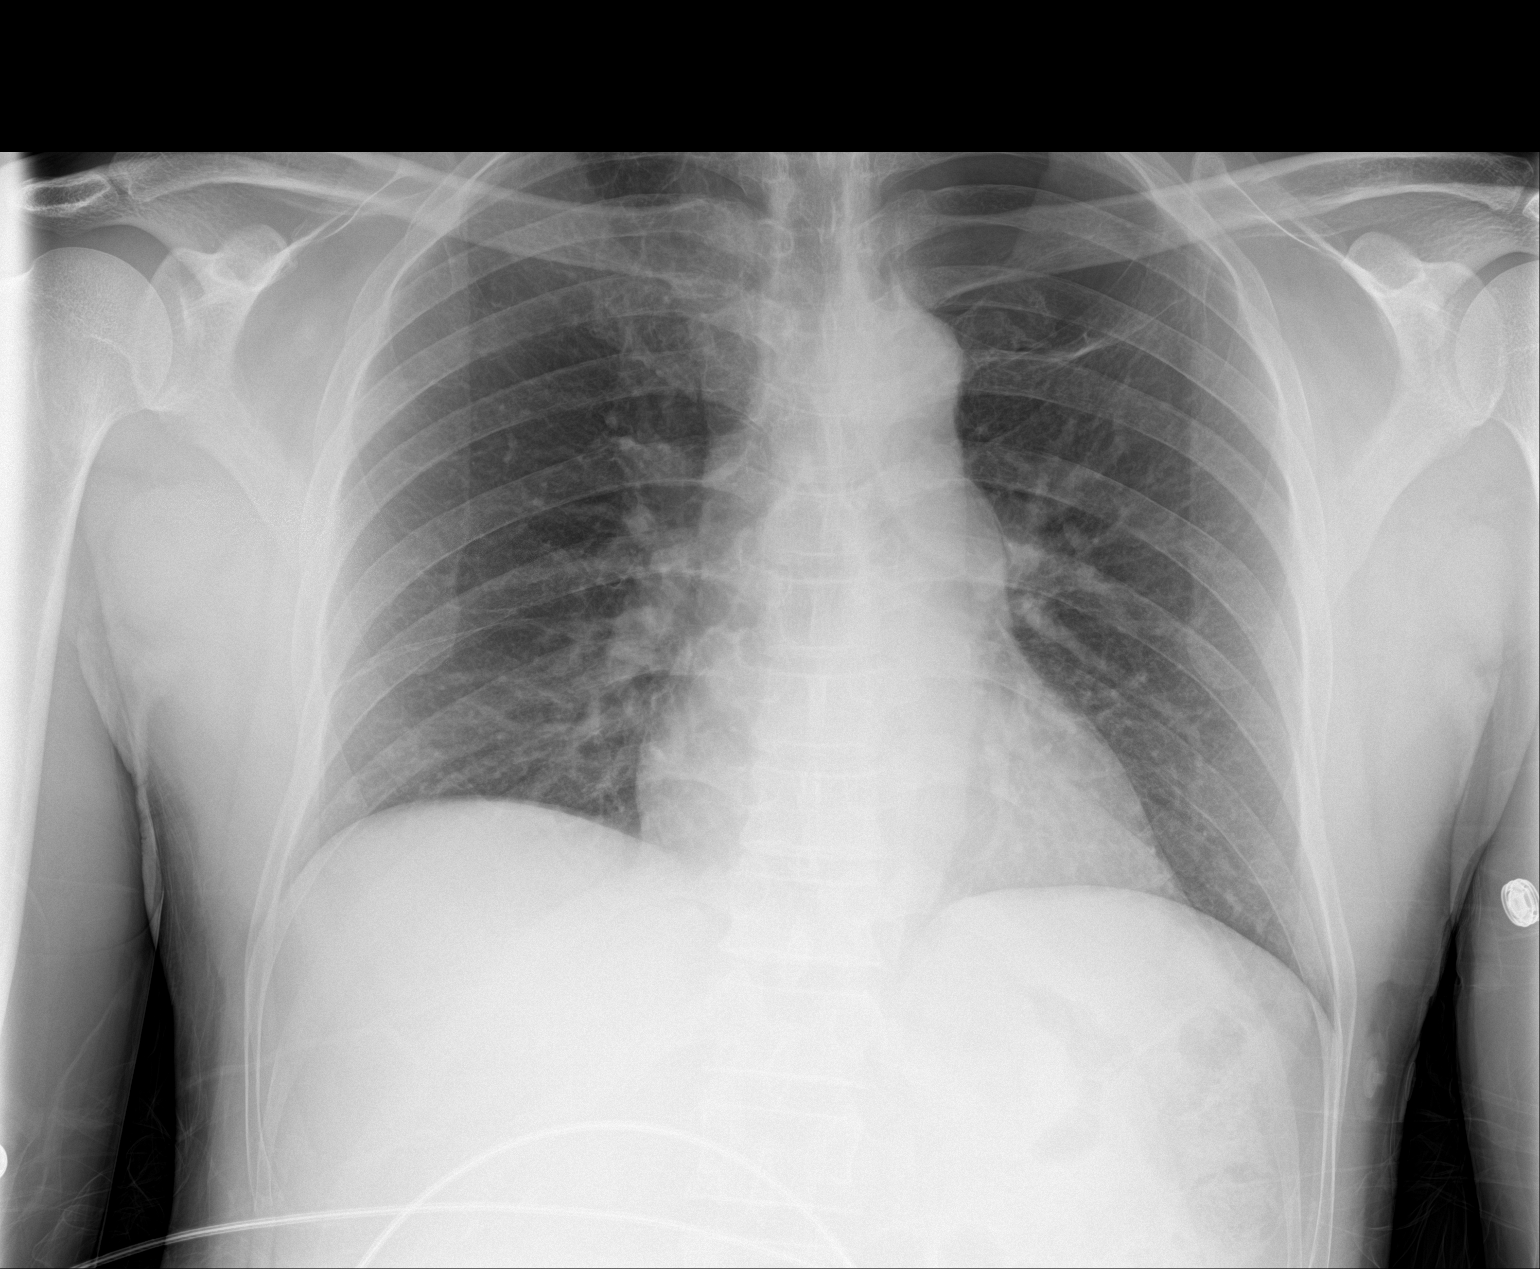

[2 of 2 positions shown; findings below may reference images not displayed]

FINDINGS: Normal mediastinum and cardiac silhouette. Normal pulmonary
vasculature. No evidence of effusion, infiltrate, or pneumothorax.
No acute bony abnormality.
IMPRESSION: No acute cardiopulmonary process.

## 2017-02-03 ENCOUNTER — Ambulatory Visit (INDEPENDENT_AMBULATORY_CARE_PROVIDER_SITE_OTHER): Payer: BLUE CROSS/BLUE SHIELD | Admitting: Internal Medicine

## 2017-02-03 VITALS — BP 135/72 | HR 84 | Temp 98.3°F | Ht 69.0 in | Wt 152.5 lb

## 2017-02-03 DIAGNOSIS — Z114 Encounter for screening for human immunodeficiency virus [HIV]: Secondary | ICD-10-CM

## 2017-02-03 DIAGNOSIS — Z0000 Encounter for general adult medical examination without abnormal findings: Secondary | ICD-10-CM

## 2017-02-03 DIAGNOSIS — L299 Pruritus, unspecified: Secondary | ICD-10-CM

## 2017-02-03 DIAGNOSIS — F17210 Nicotine dependence, cigarettes, uncomplicated: Secondary | ICD-10-CM | POA: Diagnosis not present

## 2017-02-03 DIAGNOSIS — R112 Nausea with vomiting, unspecified: Secondary | ICD-10-CM

## 2017-02-03 DIAGNOSIS — R740 Nonspecific elevation of levels of transaminase and lactic acid dehydrogenase [LDH]: Secondary | ICD-10-CM | POA: Diagnosis not present

## 2017-02-03 DIAGNOSIS — Z720 Tobacco use: Secondary | ICD-10-CM

## 2017-02-03 DIAGNOSIS — R7401 Elevation of levels of liver transaminase levels: Secondary | ICD-10-CM

## 2017-02-03 MED ORDER — CETIRIZINE HCL 10 MG PO TABS: 10.0000 mg | ORAL_TABLET | Freq: Every day | ORAL | 1 refills | Status: DC

## 2017-02-03 NOTE — Progress Notes (Signed)
   CC: Bilateral leg itching plus episode of nausea and vomiting  HPI:  [name] is a 56 y.o. man here today for ongoing leg itching and discomfort for the past few weeks now with and episode of nausea and vomiting at work.   See problem based assessment and plan below for additional details  Past Medical History:  Diagnosis Date  . Hypertension     Review of Systems:  Review of Systems  Constitutional: Positive for weight loss.  Respiratory: Negative for shortness of breath.   Cardiovascular: Negative for leg swelling.  Gastrointestinal: Positive for nausea and vomiting. Negative for abdominal pain, diarrhea and heartburn.  Genitourinary: Negative for dysuria.  Skin: Positive for itching. Negative for rash.  Neurological: Negative for dizziness and focal weakness.  Endo/Heme/Allergies: Negative for environmental allergies.    Physical Exam: Physical Exam  Constitutional: He is well-developed, well-nourished, and in no distress.  HENT:  Mouth/Throat: Oropharynx is clear and moist.  Neck: No thyromegaly present.  Cardiovascular: Normal rate and regular rhythm.   Pulmonary/Chest: Effort normal and breath sounds normal.  Abdominal: Soft. Bowel sounds are normal. There is no tenderness.  Genitourinary: Penis normal. No discharge found.  Musculoskeletal: Normal range of motion. He exhibits no edema.  Lymphadenopathy:    He has no cervical adenopathy.  Neurological: He is alert. He has normal reflexes. Gait normal.  Skin: No rash noted.  Scattered 2-133mm hypopigmented spots across arms, legs, trunk  Psychiatric: Affect normal.    Vitals:   02/03/17 0954  BP: 135/72  Pulse: 84  Temp: 98.3 F (36.8 C)  TempSrc: Oral  SpO2: 98%  Weight: 152 lb 8 oz (69.2 kg)  Height: 5\' 9"  (1.753 m)    Assessment & Plan:   See Encounters Tab for problem based charting.  Patient discussed with Dr. Heide SparkNarendra

## 2017-02-03 NOTE — Assessment & Plan Note (Addendum)
HPI: He notices leg itching that feels similar to past jock itch but it extends down both legs to above the knees and he has not noticed any rash. This is worst while at work. It worsens and improves spontaneously and sometimes accompanies a flushing or hot sensation.   A: Intermittent lower body pruritis without rash. This etiology is not clear today. We will plan to check  Labs as he has previously shown some liver function abnormality or acute kidney injury that could be associated with a new metabolic cause of his symptoms  P: CMP today Recommend starting cetirizine daily PRN He can try benadryl if ineffective and start with use at home not work as he works night shifts in a factory

## 2017-02-03 NOTE — Assessment & Plan Note (Addendum)
We will check HIV and Hepatitis C in a patient without specific risk factors for healthcare screening today

## 2017-02-03 NOTE — Assessment & Plan Note (Addendum)
Checking CMP as discussed for nausea and pruritis above

## 2017-02-03 NOTE — Patient Instructions (Addendum)
It was a pleasure to see you today Mr. George Andrews.  I am not certain at this time what the main cause for your symptoms is, so we will need to check some blood work today.  For the itching I recommend trying a non-sedating antihistamine such as cetirizine (Zyrtec) once a day. If this does not help we can try switching to a different medication.  I do recommend trying to cut back on the smoking since this can contribute to irritation of your digestive tract.  I would like to see you again within a few weeks to check how you are doing or if symptoms change we might have new information.

## 2017-02-03 NOTE — Assessment & Plan Note (Signed)
He increased smoking to over 1/2 ppd due to more stress with his work schedule. He thinks this will decrease when his workload improves.

## 2017-02-03 NOTE — Assessment & Plan Note (Signed)
HPI: He had an episode of unprovoked nausea and vomiting at work. This has not been a problem before. His overall health has been suffering lately due a change in work schedule to 12 hour night shifts recently. He is sleeping only 3-4 hours each day during his workdays and not eating regularly. He feels more stressed out by this problem and is smoking more than 1/2 ppd now and drinking enough to "de-stress" 2-3 days a week. He thinks his pants are getting looser due to this lifestyle and not eating enough.  A: Nausea and vomiting. This was from an unclear cause although he has some risk factors for gastritis that could be a trigger. He has no systemic or lower GI symptoms to suggest an infectious enteropathy. He has never had significant heartburn, indigestion, or past peptic ulcers. I do not think an isolated event needs more workup and treatment at this time.  P: Checking serum chemistry today If this problem continues we may need to consider underlying GI disorders

## 2017-02-04 LAB — CMP14 + ANION GAP
A/G RATIO: 1.4 (ref 1.2–2.2)
ALT: 30 IU/L (ref 0–44)
AST: 28 IU/L (ref 0–40)
Albumin: 4.1 g/dL (ref 3.5–5.5)
Alkaline Phosphatase: 62 IU/L (ref 39–117)
Anion Gap: 14 mmol/L (ref 10.0–18.0)
BILIRUBIN TOTAL: 0.4 mg/dL (ref 0.0–1.2)
BUN / CREAT RATIO: 8 — AB (ref 9–20)
BUN: 8 mg/dL (ref 6–24)
CALCIUM: 9.8 mg/dL (ref 8.7–10.2)
CHLORIDE: 102 mmol/L (ref 96–106)
CO2: 24 mmol/L (ref 18–29)
Creatinine, Ser: 0.95 mg/dL (ref 0.76–1.27)
GFR, EST AFRICAN AMERICAN: 104 mL/min/{1.73_m2} (ref >59)
GFR, EST NON AFRICAN AMERICAN: 90 mL/min/{1.73_m2} (ref >59)
GLOBULIN, TOTAL: 3 g/dL (ref 1.5–4.5)
GLUCOSE: 96 mg/dL (ref 65–99)
POTASSIUM: 4.7 mmol/L (ref 3.5–5.2)
SODIUM: 140 mmol/L (ref 134–144)
TOTAL PROTEIN: 7.1 g/dL (ref 6.0–8.5)

## 2017-02-04 LAB — CBC
Hematocrit: 43.8 % (ref 37.5–51.0)
Hemoglobin: 14.9 g/dL (ref 13.0–17.7)
MCH: 31.7 pg (ref 26.6–33.0)
MCHC: 34 g/dL (ref 31.5–35.7)
MCV: 93 fL (ref 79–97)
PLATELETS: 183 10*3/uL (ref 150–379)
RBC: 4.7 x10E6/uL (ref 4.14–5.80)
RDW: 13.9 % (ref 12.3–15.4)
WBC: 9.2 10*3/uL (ref 3.4–10.8)

## 2017-02-04 LAB — HIV ANTIBODY (ROUTINE TESTING W REFLEX): HIV SCREEN 4TH GENERATION: NONREACTIVE

## 2017-02-04 LAB — HEPATITIS C ANTIBODY

## 2017-02-05 NOTE — Progress Notes (Signed)
Internal Medicine Clinic Attending  Case discussed with Dr. Rice at the time of the visit.  We reviewed the resident's history and exam and pertinent patient test results.  I agree with the assessment, diagnosis, and plan of care documented in the resident's note.  

## 2017-10-27 ENCOUNTER — Emergency Department (HOSPITAL_COMMUNITY): Payer: BLUE CROSS/BLUE SHIELD

## 2017-10-27 ENCOUNTER — Emergency Department (HOSPITAL_COMMUNITY)
Admission: EM | Admit: 2017-10-27 | Discharge: 2017-10-27 | Disposition: A | Discharge status: Home / Self Care | Payer: BLUE CROSS/BLUE SHIELD | Attending: Emergency Medicine | Admitting: Emergency Medicine

## 2017-10-27 ENCOUNTER — Encounter (HOSPITAL_COMMUNITY): Payer: Self-pay | Admitting: Emergency Medicine

## 2017-10-27 DIAGNOSIS — Z8679 Personal history of other diseases of the circulatory system: Secondary | ICD-10-CM | POA: Insufficient documentation

## 2017-10-27 DIAGNOSIS — I10 Essential (primary) hypertension: Secondary | ICD-10-CM | POA: Insufficient documentation

## 2017-10-27 DIAGNOSIS — R202 Paresthesia of skin: Secondary | ICD-10-CM | POA: Diagnosis not present

## 2017-10-27 DIAGNOSIS — F17210 Nicotine dependence, cigarettes, uncomplicated: Secondary | ICD-10-CM | POA: Diagnosis not present

## 2017-10-27 DIAGNOSIS — Z79899 Other long term (current) drug therapy: Secondary | ICD-10-CM | POA: Diagnosis not present

## 2017-10-27 LAB — COMPREHENSIVE METABOLIC PANEL
ALBUMIN: 3.8 g/dL (ref 3.5–5.0)
ALK PHOS: 81 U/L (ref 38–126)
ALT: 34 U/L (ref 17–63)
AST: 43 U/L — AB (ref 15–41)
Anion gap: 12 (ref 5–15)
BUN: 10 mg/dL (ref 6–20)
CALCIUM: 8.8 mg/dL — AB (ref 8.9–10.3)
CO2: 23 mmol/L (ref 22–32)
CREATININE: 0.85 mg/dL (ref 0.61–1.24)
Chloride: 102 mmol/L (ref 101–111)
GFR calc Af Amer: >60 mL/min (ref >60)
GLUCOSE: 101 mg/dL — AB (ref 65–99)
Potassium: 3.6 mmol/L (ref 3.5–5.1)
Sodium: 137 mmol/L (ref 135–145)
TOTAL PROTEIN: 7.7 g/dL (ref 6.5–8.1)
Total Bilirubin: 0.5 mg/dL (ref 0.3–1.2)

## 2017-10-27 LAB — CBC
HEMATOCRIT: 46.6 % (ref 39.0–52.0)
HEMOGLOBIN: 15.7 g/dL (ref 13.0–17.0)
MCH: 31.4 pg (ref 26.0–34.0)
MCHC: 33.7 g/dL (ref 30.0–36.0)
MCV: 93.2 fL (ref 78.0–100.0)
Platelets: 208 10*3/uL (ref 150–400)
RBC: 5 MIL/uL (ref 4.22–5.81)
RDW: 14 % (ref 11.5–15.5)
WBC: 10.1 10*3/uL (ref 4.0–10.5)

## 2017-10-27 LAB — DIFFERENTIAL
BASOS ABS: 0 10*3/uL (ref 0.0–0.1)
Basophils Relative: 0 %
EOS ABS: 0.3 10*3/uL (ref 0.0–0.7)
Eosinophils Relative: 3 %
LYMPHS ABS: 3.7 10*3/uL (ref 0.7–4.0)
LYMPHS PCT: 36 %
MONOS PCT: 6 %
Monocytes Absolute: 0.6 10*3/uL (ref 0.1–1.0)
NEUTROS PCT: 55 %
Neutro Abs: 5.5 10*3/uL (ref 1.7–7.7)

## 2017-10-27 LAB — I-STAT CHEM 8, ED
BUN: 11 mg/dL (ref 6–20)
CREATININE: 0.8 mg/dL (ref 0.61–1.24)
Calcium, Ion: 1.08 mmol/L — ABNORMAL LOW (ref 1.15–1.40)
Chloride: 102 mmol/L (ref 101–111)
GLUCOSE: 96 mg/dL (ref 65–99)
HEMATOCRIT: 49 % (ref 39.0–52.0)
HEMOGLOBIN: 16.7 g/dL (ref 13.0–17.0)
Potassium: 3.5 mmol/L (ref 3.5–5.1)
Sodium: 140 mmol/L (ref 135–145)
TCO2: 26 mmol/L (ref 22–32)

## 2017-10-27 LAB — I-STAT TROPONIN, ED: TROPONIN I, POC: 0 ng/mL (ref 0.00–0.08)

## 2017-10-27 LAB — PROTIME-INR
INR: 1.01
Prothrombin Time: 13.2 seconds (ref 11.4–15.2)

## 2017-10-27 LAB — CBG MONITORING, ED: Glucose-Capillary: 109 mg/dL — ABNORMAL HIGH (ref 65–99)

## 2017-10-27 LAB — APTT: APTT: 35 s (ref 24–36)

## 2017-10-27 MED ORDER — AMLODIPINE BESYLATE 5 MG PO TABS: 5.0000 mg | ORAL_TABLET | Freq: Every day | ORAL | 1 refills | Status: DC

## 2017-10-27 MED ORDER — IBUPROFEN 800 MG PO TABS: 800.0000 mg | ORAL_TABLET | Freq: Three times a day (TID) | ORAL | 0 refills | Status: AC

## 2017-10-27 NOTE — ED Notes (Signed)
Pt still at MRI

## 2017-10-27 NOTE — ED Notes (Signed)
Patient transported to MRI 

## 2017-10-27 NOTE — Discharge Instructions (Signed)
Please follow with your primary care doctor in the next 2 days for a check-up. They must obtain records for further management.  ° °Do not hesitate to return to the Emergency Department for any new, worsening or concerning symptoms.  ° °

## 2017-10-27 NOTE — ED Provider Notes (Signed)
[facility] EMERGENCY DEPARTMENT Provider Note   CSN: [id number] Arrival date & time: 10/27/17  0458     History   Chief Complaint Chief Complaint  Patient presents with  . Tingling    HPI   Blood pressure (!) 159/85, pulse 88, temperature 98.4 F (36.9 C), temperature source Oral, resp. rate 16, height 5\' 9"  (1.753 m), weight 72.6 kg (160 lb), SpO2 99 %.  George Andrews is a 57 y.o. male with PMH significant for tobacco use, hypertension, subarachnoid complaining of tingling to right fifth fourth and third digits onset 3 days ago.  He has some pain associated with the elbow. 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  He states that he does not have significant repetitive motion on the right hand.  He also has some numbness in all fingers of the left hand which he noticed yesterday and some numbness along the part of the face which is now resolved.  Patient has a history of hypertension, he stopped taking his blood pressure medication approximately 1 year ago because he felt that his blood pressure was well controlled, he states that he checks it periodically.  He smokes cigarettes, patient denies any change in vision, dysarthria, ataxia, difficulty swallowing, weakness, or chest pain.  Also denies any neck pain or trauma.  PCP: IMTS  Past Medical History:  Diagnosis Date  . Hypertension     Patient Active Problem List   Diagnosis Date Noted  . Itching 02/03/2017  . Non-intractable vomiting with nausea 02/03/2017  . Transaminitis 11/21/2015  . Lateral femoral cutaneous neuropathy 11/21/2015  . Tobacco abuse 02/06/2015  . Preventative health care 11/15/2014  . Sciatica 11/15/2014  . Essential hypertension 11/08/2014  . SAH (subarachnoid hemorrhage) (HCC) 11/08/2014  . Alcohol abuse with intoxication (HCC) 11/05/2014    History reviewed. No pertinent surgical history.     Home Medications    Prior to Admission medications     Medication Sig Start Date End Date Taking? Authorizing Provider  amLODipine (NORVASC) 5 MG tablet Take 1 tablet (5 mg total) by mouth daily. 10/27/17   Julliette Frentz, Joni Reining, PA-C  cetirizine (ZYRTEC) 10 MG tablet Take 1 tablet (10 mg total) by mouth daily. Patient not taking: Reported on 10/27/2017 02/03/17   Fuller Plan, MD  ibuprofen (ADVIL,MOTRIN) 800 MG tablet Take 1 tablet (800 mg total) by mouth 3 (three) times daily for 5 days. 10/27/17 11/01/17  Romie Keeble, Mardella Layman    Family History No family history on file.  Social History Social History   Tobacco Use  . Smoking status: Current Every Day Smoker    Packs/day: 0.50    Types: Cigarettes  . Smokeless tobacco: Never Used  . Tobacco comment: Smokes 1/2 PPD  Substance Use Topics  . Alcohol use: Yes    Alcohol/week: 0.0 oz  . Drug use: No     Allergies   Patient has no known allergies.   Review of Systems Review of Systems  A complete review of systems was obtained and all systems are negative except as noted in the HPI and PMH.    Physical Exam Updated Vital Signs BP (!) 171/82   Pulse 77   Temp 98.7 F (37.1 C)   Resp 18   Ht 5\' 9"  (1.753 m)   Wt 72.6 kg (160 lb)   SpO2 96%   BMI 23.63 kg/m   Physical Exam  Constitutional: He is oriented to person, place, and time. He  appears well-developed and well-nourished.  HENT:  Head: Normocephalic and atraumatic.  Mouth/Throat: Oropharynx is clear and moist.  Eyes: Conjunctivae and EOM are normal. Pupils are equal, round, and reactive to light.  No TTP of maxillary or frontal sinuses  No TTP or induration of temporal arteries bilaterally  Neck: Normal range of motion. Neck supple.  FROM to C-spine. Pt can touch chin to chest without discomfort. No TTP of midline cervical spine.  Spurling test negative bilaterally.   Cardiovascular: Normal rate, regular rhythm and intact distal pulses.  Pulmonary/Chest: Effort normal and breath sounds normal. No respiratory  distress. He has no wheezes. He has no rales. He exhibits no tenderness.  Abdominal: Soft. Bowel sounds are normal. There is no tenderness.  Musculoskeletal: Normal range of motion. He exhibits no edema or tenderness.  Phalen test is positive on the right side, negative on the left, Tinel is negative bilaterally.  Neurological: He is alert and oriented to person, place, and time. No cranial nerve deficit.  II-Visual fields grossly intact. III/IV/VI-Extraocular movements intact.  Pupils reactive bilaterally. V/VII-Smile symmetric, equal eyebrow raise,  facial sensation intact VIII- Hearing grossly intact IX/X-Normal gag XI-bilateral shoulder shrug XII-midline tongue extension Motor: 5/5 bilaterally with normal tone and bulk Cerebellar: Normal finger-to-nose  and normal heel-to-shin test.   Romberg negative Ambulates with a coordinated gait   Nursing note and vitals reviewed.    ED Treatments / Results  Labs (all labs ordered are listed, but only abnormal results are displayed) Labs Reviewed  COMPREHENSIVE METABOLIC PANEL - Abnormal; Notable for the following components:      Result Value   Glucose, Bld 101 (*)    Calcium 8.8 (*)    AST 43 (*)    All other components within normal limits  CBG MONITORING, ED - Abnormal; Notable for the following components:   Glucose-Capillary 109 (*)    All other components within normal limits  I-STAT CHEM 8, ED - Abnormal; Notable for the following components:   Calcium, Ion 1.08 (*)    All other components within normal limits  PROTIME-INR  APTT  CBC  DIFFERENTIAL  I-STAT TROPONIN, ED    EKG  EKG Interpretation None       Radiology Ct Head Wo Contrast  Result Date: 10/27/2017 CLINICAL DATA:  57 year old male with numbness and tingling and paresthesia. EXAM: CT HEAD WITHOUT CONTRAST TECHNIQUE: Contiguous axial images were obtained from the base of the skull through the vertex without intravenous contrast. COMPARISON:  Head CT  dated 11/06/2014 FINDINGS: Brain: No evidence of acute infarction, hemorrhage, hydrocephalus, extra-axial collection or mass lesion/mass effect. Vascular: No hyperdense vessel or unexpected calcification. Skull: Normal. Negative for fracture or focal lesion. Sinuses/Orbits: Mild mucoperiosteal thickening of paranasal sinuses. No air-fluid levels. The mastoid air cells are clear. Other: None IMPRESSION: Unremarkable noncontrast CT of the brain. Electronically Signed   By: Elgie CollardArash  Radparvar M.D.   On: 10/27/2017 05:46   Mr Brain Wo Contrast  Result Date: 10/27/2017 CLINICAL DATA:  Acute onset of left facial numbness and tingling yesterday. First through third digits are numb along both hands. EXAM: MRI HEAD WITHOUT CONTRAST MRI CERVICAL SPINE WITHOUT CONTRAST TECHNIQUE: Multiplanar, multiecho pulse sequences of the brain and surrounding structures, and cervical spine, to include the craniocervical junction and cervicothoracic junction, were obtained without intravenous contrast. COMPARISON:  Head CT 10/27/2017 FINDINGS: MRI HEAD FINDINGS Brain: No acute infarction, hemorrhage, hydrocephalus, extra-axial collection or mass lesion. No white matter disease or atrophy. No findings along the left  trigeminal nerve. Vascular: Normal flow voids. Skull and upper cervical spine: Normal marrow signal. Sinuses/Orbits: Negative. MRI CERVICAL SPINE FINDINGS Alignment: Normal Vertebrae: No fracture, evidence of discitis, or bone lesion. Cord: Normal signal and morphology. Posterior Fossa, vertebral arteries, paraspinal tissues: Negative. Disc levels: Mild spondylosis in the midcervical spine. No herniation or impingement. IMPRESSION: Brain MRI: Normal.  No infarct or evidence of demyelination. Cervical spine: Negative.  No impingement or evidence of myelopathy. Electronically Signed   By: Marnee Spring M.D.   On: 10/27/2017 09:31   Mr Cervical Spine Wo Contrast  Result Date: 10/27/2017 CLINICAL DATA:  Acute onset of left  facial numbness and tingling yesterday. First through third digits are numb along both hands. EXAM: MRI HEAD WITHOUT CONTRAST MRI CERVICAL SPINE WITHOUT CONTRAST TECHNIQUE: Multiplanar, multiecho pulse sequences of the brain and surrounding structures, and cervical spine, to include the craniocervical junction and cervicothoracic junction, were obtained without intravenous contrast. COMPARISON:  Head CT 10/27/2017 FINDINGS: MRI HEAD FINDINGS Brain: No acute infarction, hemorrhage, hydrocephalus, extra-axial collection or mass lesion. No white matter disease or atrophy. No findings along the left trigeminal nerve. Vascular: Normal flow voids. Skull and upper cervical spine: Normal marrow signal. Sinuses/Orbits: Negative. MRI CERVICAL SPINE FINDINGS Alignment: Normal Vertebrae: No fracture, evidence of discitis, or bone lesion. Cord: Normal signal and morphology. Posterior Fossa, vertebral arteries, paraspinal tissues: Negative. Disc levels: Mild spondylosis in the midcervical spine. No herniation or impingement. IMPRESSION: Brain MRI: Normal.  No infarct or evidence of demyelination. Cervical spine: Negative.  No impingement or evidence of myelopathy. Electronically Signed   By: Marnee Spring M.D.   On: 10/27/2017 09:31    Procedures Procedures (including critical care time)  Medications Ordered in ED Medications - No data to display   Initial Impression / Assessment and Plan / ED Course  I have reviewed the triage vital signs and the nursing notes.  Pertinent labs & imaging results that were available during my care of the patient were reviewed by me and considered in my medical decision making (see chart for details).     . Vitals:   10/27/17 0945 10/27/17 0953 10/27/17 1000 10/27/17 1000  BP: (!) 162/82  (!) 171/82   Pulse: 78  77   Resp:    18  Temp:  98.7 F (37.1 C)    TempSrc:      SpO2: 95%  96%   Weight:      Height:         George Andrews is 57 y.o. male presenting with  numbness and paresthesia to right arm onset 3 days ago, he then developed it in the right hand.  This does seem to be positional, he has a nonfocal neurologic exam however he did have some left-sided facial numbness and paresthesia.  No cervicalgia.  Given his history of subarachnoid, untreated hypertension and tobacco use I think it is reasonable to obtain an MRI.  This is a shared visit with the attending physician who personally evaluated the patient and agrees with the care plan.   MRI of head and cervical spine negative.  Patient will be started on anti-inflammatory medications, work note provided.  Will follow closely with primary care.  Evaluation does not show pathology that would require ongoing emergent intervention or inpatient treatment. Pt is hemodynamically stable and mentating appropriately. Discussed findings and plan with patient/guardian, who agrees with care plan. All questions answered. Return precautions discussed and outpatient follow up given.     Final Clinical Impressions(s) /  ED Diagnoses   Final diagnoses:  Paresthesia  Hypertension, unspecified type    ED Discharge Orders        Ordered    amLODipine (NORVASC) 5 MG tablet  Daily     10/27/17 1003    ibuprofen (ADVIL,MOTRIN) 800 MG tablet  3 times daily     10/27/17 1003       Lahoma Constantin, Mardella Layman 10/27/17 1433    Tilden Fossa, MD 10/31/17 [phone]

## 2017-10-27 NOTE — ED Notes (Signed)
Pt denies numbness at this time.

## 2017-10-27 NOTE — ED Triage Notes (Signed)
Pt reports R hand tingling X3 days. Pt states around midnight he had onset of L hand tinging, 2-3 hrs later developed L facial tingling. Pt denies HA, blurred vision, numbness, weakness. Equal grips noted, no sensory deficits. Hypertensive in triage. EDP aware of pt. No Code Stroke at this time.

## 2018-02-27 ENCOUNTER — Encounter: Payer: Self-pay | Admitting: *Deleted

## 2018-11-22 ENCOUNTER — Ambulatory Visit (HOSPITAL_COMMUNITY)
Admission: EM | Admit: 2018-11-22 | Discharge: 2018-11-22 | Disposition: A | Discharge status: Home / Self Care | Payer: Commercial Managed Care - PPO | Attending: Family Medicine | Admitting: Family Medicine

## 2018-11-22 ENCOUNTER — Encounter (HOSPITAL_COMMUNITY): Payer: Self-pay | Admitting: Emergency Medicine

## 2018-11-22 ENCOUNTER — Other Ambulatory Visit: Payer: Self-pay

## 2018-11-22 DIAGNOSIS — L509 Urticaria, unspecified: Secondary | ICD-10-CM | POA: Diagnosis not present

## 2018-11-22 DIAGNOSIS — I10 Essential (primary) hypertension: Secondary | ICD-10-CM | POA: Diagnosis not present

## 2018-11-22 MED ORDER — METHYLPREDNISOLONE SODIUM SUCC 125 MG IJ SOLR
125.0000 mg | Freq: Once | INTRAMUSCULAR | Status: AC
  Administered 2018-11-22: 125 mg via INTRAMUSCULAR

## 2018-11-22 MED ORDER — HYDROXYZINE HCL 25 MG PO TABS: 25.0000 mg | ORAL_TABLET | Freq: Four times a day (QID) | ORAL | 0 refills | Status: DC

## 2018-11-22 MED ORDER — METHYLPREDNISOLONE SODIUM SUCC 125 MG IJ SOLR
INTRAMUSCULAR | Status: AC
  Filled 2018-11-22: qty 2

## 2018-11-22 MED ORDER — PREDNISONE 20 MG PO TABS: 20.0000 mg | ORAL_TABLET | Freq: Two times a day (BID) | ORAL | 0 refills | Status: AC

## 2018-11-22 NOTE — Discharge Instructions (Signed)
Rest and push fluids Steroid shot given in office Prednisone prescribed.  Take as directed and to completion Continue with benadryl cream and/or OTC zyrtec, allegra, or Claritin during the day as needed for symptomatic relief Hydroxyzine as needed for itching.  Do not use prior to driving or operating heavy machinery as this medication may make you drowsy Follow up with PCP if symptoms persists Return or go to the ED if you have any new or worsening symptoms such as difficulty breathing, SOB, chest pain, nausea, vomiting, throat tightness or swelling, tongue/lip swelling or tingling, abdominal pain, changes in bowel or bladder habits, etc..Marland Kitchen

## 2018-11-22 NOTE — ED Triage Notes (Signed)
Pt here for hives to arms x 2 days with itching

## 2018-11-22 NOTE — ED Provider Notes (Signed)
[facility]   [id number] 11/22/18 Arrival Time: 1351  CC: SKIN COMPLAINT  SUBJECTIVE:  George Andrews is a 58 y.o. male hx significant for HTN, who presents with a hives to bilateral arms and trunk x 1 day.  Denies precipitating event or trauma.  Denies changes in soaps, detergents, close contacts with similar rash, known trigger or environmental trigger, or allergy. Denies medications change or starting a new medication recently.  Localizes the rash to bilateral arms and trunk.  Describes it as itchy and red.  Has tried OTC benadryl cream with relief.  Denies similar symptoms in the past.   Denies fever, chills, nausea, vomiting,discharge, oral lesions, mouth, lip or tongue swelling, dysphagia, dyspnea, SOB, chest pain, abdominal pain, changes in bowel or bladder function.    ROS: As per HPI.  Past Medical History:  Diagnosis Date  . Hypertension    History reviewed. No pertinent surgical history. No Known Allergies No current facility-administered medications on file prior to encounter.    Current Outpatient Medications on File Prior to Encounter  Medication Sig Dispense Refill  . amLODipine (NORVASC) 5 MG tablet Take 1 tablet (5 mg total) by mouth daily. 30 tablet 1   Social History   Socioeconomic History  . Marital status: Single    Spouse name: Not on file  . Number of children: Not on file  . Years of education: Not on file  . Highest education level: Not on file  Occupational History  . Not on file  Social Needs  . Financial resource strain: Not on file  . Food insecurity:    Worry: Not on file    Inability: Not on file  . Transportation needs:    Medical: Not on file    Non-medical: Not on file  Tobacco Use  . Smoking status: Current Every Day Smoker    Packs/day: 0.50    Types: Cigarettes  . Smokeless tobacco: Never Used  . Tobacco comment: Smokes 1/2 PPD  Substance and Sexual Activity  . Alcohol use: Yes    Alcohol/week: 0.0 standard drinks  .  Drug use: No  . Sexual activity: Not on file  Lifestyle  . Physical activity:    Days per week: Not on file    Minutes per session: Not on file  . Stress: Not on file  Relationships  . Social connections:    Talks on phone: Not on file    Gets together: Not on file    Attends religious service: Not on file    Active member of club or organization: Not on file    Attends meetings of clubs or organizations: Not on file    Relationship status: Not on file  . Intimate partner violence:    Fear of current or ex partner: Not on file    Emotionally abused: Not on file    Physically abused: Not on file    Forced sexual activity: Not on file  Other Topics Concern  . Not on file  Social History Narrative  . Not on file   History reviewed. No pertinent family history.  OBJECTIVE: Vitals:   11/22/18 1413  BP: (!) 172/82  Pulse: (!) 101  Resp: 18  Temp: 98.4 F (36.9 C)  TempSrc: Oral  SpO2: 97%    General appearance: alert; no distress Head: NCAT Lungs: clear to auscultation bilaterally Heart: regular rate and rhythm.  Radial pulse 2+ bilaterally Extremities: no edema Skin: warm and dry; mildly erythematous raised urticarial rash diffuse  about bilateral upper extremities, and anterior and posterior trunk, NTTP, no obvious drainage or bleeding (see pictures below) Psychological: alert and cooperative; normal mood and affect            ASSESSMENT & PLAN:  1. Urticaria     Meds ordered this encounter  Medications  . methylPREDNISolone sodium succinate (SOLU-MEDROL) 125 mg/2 mL injection 125 mg  . predniSONE (DELTASONE) 20 MG tablet    Sig: Take 1 tablet (20 mg total) by mouth 2 (two) times daily with a meal for 5 days.    Dispense:  10 tablet    Refill:  0    Order Specific Question:   Supervising Provider    Answer:   Eustace Moore [[id number]]  . hydrOXYzine (ATARAX/VISTARIL) 25 MG tablet    Sig: Take 1 tablet (25 mg total) by mouth every 6 (six) hours.     Dispense:  12 tablet    Refill:  0    Order Specific Question:   Supervising Provider    Answer:   Eustace Moore [[id number]]   Rest and push fluids Steroid shot given in office Prednisone prescribed.  Take as directed and to completion Continue with benadryl cream and/or OTC zyrtec, allegra, or Claritin during the day as needed for symptomatic relief Hydroxyzine as needed for itching.  Do not use prior to driving or operating heavy machinery as this medication may make you drowsy Follow up with PCP if symptoms persists Return or go to the ED if you have any new or worsening symptoms such as difficulty breathing, SOB, chest pain, nausea, vomiting, throat tightness or swelling, tongue/lip swelling or tingling, abdominal pain, changes in bowel or bladder habits, etc...  Reviewed expectations re: course of current medical issues. Questions answered. Outlined signs and symptoms indicating need for more acute intervention. Patient verbalized understanding. After Visit Summary given.   Rennis Harding, PA-C 11/22/18 1534

## 2019-02-26 ENCOUNTER — Encounter: Payer: Self-pay | Admitting: *Deleted

## 2019-05-18 ENCOUNTER — Encounter (HOSPITAL_COMMUNITY): Payer: Self-pay | Admitting: Emergency Medicine

## 2019-05-18 ENCOUNTER — Other Ambulatory Visit: Payer: Self-pay

## 2019-05-18 ENCOUNTER — Ambulatory Visit (HOSPITAL_COMMUNITY)
Admission: EM | Admit: 2019-05-18 | Discharge: 2019-05-18 | Disposition: A | Discharge status: Home / Self Care | Payer: Commercial Managed Care - PPO | Attending: Family Medicine | Admitting: Family Medicine

## 2019-05-18 DIAGNOSIS — R21 Rash and other nonspecific skin eruption: Secondary | ICD-10-CM | POA: Diagnosis not present

## 2019-05-18 MED ORDER — PERMETHRIN 5 % EX CREA: TOPICAL_CREAM | CUTANEOUS | 1 refills | Status: DC

## 2019-05-18 NOTE — ED Provider Notes (Signed)
[facility]    CSN: [id number] Arrival date & time: 05/18/19  1251      History   Chief Complaint Chief Complaint  Patient presents with  . Insect Bite    HPI George Andrews is a [age] y.o. male.   Pt is a 58 year old male that presents with rash. This has been present and worse over the past 4 days. Started the day after a cookout where he sat on some bodies outdoor couch. Describes as very itchy. He has been taking benadryl which helps some.    Denies any fever, joint pain. Denies any recent changes in lotions, detergents, foods or other possible irritants. No recent travel. Nobody else at home has the rash. Patient has been outside but denies any contact with plants or insects. No new foods or medications.   ROS per HPI       Past Medical History:  Diagnosis Date  . Hypertension     Patient Active Problem List   Diagnosis Date Noted  . Itching 02/03/2017  . Non-intractable vomiting with nausea 02/03/2017  . Transaminitis 11/21/2015  . Lateral femoral cutaneous neuropathy 11/21/2015  . Tobacco abuse 02/06/2015  . Preventative health care 11/15/2014  . Sciatica 11/15/2014  . Essential hypertension 11/08/2014  . SAH (subarachnoid hemorrhage) (HCC) 11/08/2014  . Alcohol abuse with intoxication (HCC) 11/05/2014    History reviewed. No pertinent surgical history.     Home Medications    Prior to Admission medications   Medication Sig Start Date End Date Taking? Authorizing Provider  amLODipine (NORVASC) 5 MG tablet Take 1 tablet (5 mg total) by mouth daily. 10/27/17   Pisciotta, Joni ReiningNicole, PA-C  hydrOXYzine (ATARAX/VISTARIL) 25 MG tablet Take 1 tablet (25 mg total) by mouth every 6 (six) hours. 11/22/18   Wurst, GrenadaBritt[place], PA-C  permethrin (ELIMITE) 5 % cream Apply to affected area once 05/18/19   Janace ArisBast, Deauna Yaw A, NP    Family History History reviewed. No pertinent family history.  Social History Social History   Tobacco Use  . Smoking status: Current  Every Day Smoker    Packs/day: 0.50    Types: Cigarettes  . Smokeless tobacco: Never Used  . Tobacco comment: Smokes 1/2 PPD  Substance Use Topics  . Alcohol use: Yes    Alcohol/week: 0.0 standard drinks  . Drug use: No     Allergies   Patient has no known allergies.   Review of Systems Review of Systems   Physical Exam Triage Vital Signs ED Triage Vitals  Enc Vitals Group     BP 05/18/19 1307 (!) 144/66     Pulse Rate 05/18/19 1307 76     Resp 05/18/19 1307 12     Temp 05/18/19 1307 98 F (36.7 C)     Temp Source 05/18/19 1307 Oral     SpO2 05/18/19 1307 100 %     Weight --      Height --      Head Circumference --      Peak Flow --      Pain Score 05/18/19 1306 0     Pain Loc --      Pain Edu? --      Excl. in GC? --    No data found.  Updated Vital Signs BP (!) 144/66 (BP Location: Left Arm)   Pulse 76   Temp 98 F (36.7 C) (Oral)   Resp 12   SpO2 100%   Visual Acuity Right Eye Distance:  Left Eye Distance:   Bilateral Distance:    Right Eye Near:   Left Eye Near:    Bilateral Near:     Physical Exam Vitals signs and nursing note reviewed.  Constitutional:      Appearance: Normal appearance.  HENT:     Head: Normocephalic and atraumatic.     Nose: Nose normal.  Eyes:     Conjunctiva/sclera: Conjunctivae normal.  Neck:     Musculoskeletal: Normal range of motion.  Pulmonary:     Effort: Pulmonary effort is normal.  Musculoskeletal: Normal range of motion.  Skin:    General: Skin is warm and dry.     Findings: Rash present.     Comments: Papular erythematous rash to bilateral inner thighs  Neurological:     Mental Status: He is alert.  Psychiatric:        Mood and Affect: Mood normal.      UC Treatments / Results  Labs (all labs ordered are listed, but only abnormal results are displayed) Labs Reviewed - No data to display  EKG   Radiology No results found.  Procedures Procedures (including critical care time)   Medications Ordered in UC Medications - No data to display  Initial Impression / Assessment and Plan / UC Course  I have reviewed the triage vital signs and the nursing notes.  Pertinent labs & imaging results that were available during my care of the patient were reviewed by me and considered in my medical decision making (see chart for details).     Rash consistent with scabies Treating with permethrin cream Benadryl or Zyrtec for itching Follow up as needed for continued or worsening symptoms  Final Clinical Impressions(s) / UC Diagnoses   Final diagnoses:  Rash     Discharge Instructions     I believe that your rash may be caused by scabies Use the cream, apply from neck down and leave on for 8 to 12 hours [place] off in the morning You can repeat this in 1 week if needed Benadryl or Zyrtec for itching Follow up as needed for continued or worsening symptoms     ED Prescriptions    Medication Sig Dispense Auth. Provider   permethrin (ELIMITE) 5 % cream Apply to affected area once 60 g Maude Gloor A, NP     PDMP not reviewed this encounter.   Orvan July, NP 05/18/19 1358

## 2019-05-18 NOTE — ED Triage Notes (Signed)
Pt noticed a few bites on his right inner thigh on Monday.  Since then it has spread to his left inner thigh.  He has been taking benadryl.

## 2019-05-18 NOTE — Discharge Instructions (Signed)
I believe that your rash may be caused by scabies Use the cream, apply from neck down and leave on for 8 to 12 hours [place] off in the morning You can repeat this in 1 week if needed Benadryl or Zyrtec for itching Follow up as needed for continued or worsening symptoms

## 2019-11-20 ENCOUNTER — Encounter: Payer: Self-pay | Admitting: Internal Medicine

## 2019-11-20 ENCOUNTER — Ambulatory Visit (INDEPENDENT_AMBULATORY_CARE_PROVIDER_SITE_OTHER): Payer: Commercial Managed Care - PPO | Admitting: Internal Medicine

## 2019-11-20 ENCOUNTER — Other Ambulatory Visit: Payer: Self-pay

## 2019-11-20 VITALS — BP 161/81 | HR 102 | Temp 98.6°F | Ht 69.5 in | Wt 151.6 lb

## 2019-11-20 DIAGNOSIS — I10 Essential (primary) hypertension: Secondary | ICD-10-CM | POA: Diagnosis not present

## 2019-11-20 DIAGNOSIS — R200 Anesthesia of skin: Secondary | ICD-10-CM

## 2019-11-20 DIAGNOSIS — Z91018 Allergy to other foods: Secondary | ICD-10-CM | POA: Diagnosis not present

## 2019-11-20 DIAGNOSIS — G629 Polyneuropathy, unspecified: Secondary | ICD-10-CM | POA: Diagnosis not present

## 2019-11-20 DIAGNOSIS — M25512 Pain in left shoulder: Secondary | ICD-10-CM

## 2019-11-20 DIAGNOSIS — Z23 Encounter for immunization: Secondary | ICD-10-CM | POA: Diagnosis not present

## 2019-11-20 LAB — POCT GLYCOSYLATED HEMOGLOBIN (HGB A1C): Hemoglobin A1C: 5.3 % (ref 4.0–5.6)

## 2019-11-20 LAB — GLUCOSE, CAPILLARY: Glucose-Capillary: 98 mg/dL (ref 70–99)

## 2019-11-20 MED ORDER — NAPROXEN 500 MG PO TABS: 500.0000 mg | ORAL_TABLET | Freq: Two times a day (BID) | ORAL | 0 refills | Status: DC

## 2019-11-20 MED ORDER — AMLODIPINE BESYLATE 10 MG PO TABS: 10.0000 mg | ORAL_TABLET | Freq: Every day | ORAL | 1 refills | Status: DC

## 2019-11-20 NOTE — Assessment & Plan Note (Signed)
Finger numbness Patient has bilateral finger numbness that comes and goes. The pain does not always present in both hands. He does not know any specific triggers or anything that makes it better.  Not reproducible on exam.  No specific nerve distribution. Not likely due to diabetes given patient's BMI and it does not affect his feet.  However will rule out diabetes neuropathy.  Patient dose some heavy lifting at his job and the numbness is better on the weekends.  However his nerve distrubution is not typical of carpal tunnel. On review of patient's record he has a history of alcohol use disorder.  Denies drinking more than 2 drinks per afternoon . Drinks two liquor drinks per afternoon. Discussed standard liquor drink is 2 ounces per drink.  Also discussed cutting back to 1 drink per day Plan - We will check your hemoglobin A1c today.  - CBC -Continue discussing alcohol cessation -We will consider checking thiamine on next visit

## 2019-11-20 NOTE — Assessment & Plan Note (Signed)
Food allergies Patient reports hives with eating food with garlic powder.  He has avoided garlic powder and notices he gets hives with eating other food.  He is curious and would like to know what other food he is allergic to so we can avoid them.  He currently is taking Benadryl twice a day sometimes when eating out.   Plan: -Recommended Zyrtec during the day -Referral for allergy testing

## 2019-11-20 NOTE — Progress Notes (Signed)
   CC: high blood pressure and allergies   HPI:Mr.ABDULAH IQBAL is a 59 y.o. male who presents for evaluation of HTN, allergies, left shoulder pain, and bilateral finger numbness.. Please see individual problem based A/P for details.  Past Medical History:  Diagnosis Date  . Hypertension    Review of Systems:   ROS negative except as per HPI.  Physical Exam: Vitals:   11/20/19 1322  BP: (!) 161/81  Pulse: (!) 102  Temp: 98.6 F (37 C)  TempSrc: Oral  SpO2: 100%  Weight: 151 lb 9.6 oz (68.8 kg)  Height: 5' 9.5" (1.765 m)    General: Alert, nl appearance HE: Normocephalic, atraumatic , EOMI, Conjunctivae normal ENT: No congestion, no rhinorrhea moist, no exudate or erythema  Cardiovascular: Normal rate, regular rhythm.  No murmurs, rubs, or gallops Pulmonary : Effort normal, breath sounds normal. No wheezes, rales, or rhonchi Abdominal: soft, nontender,  bowel sounds present Musculoskeletal: Shoulder: No obvious deformity or asymmetry. No bruising. No swelling No TTP Full ROM in flexion, abduction, internal/external rotation Special Tests:  - Impingement: Neg Hawkins and Neers.  - Supraspinatus: Negative empty can.  5/5 strength - Infraspinatus/Teres: 5/5 strength with ER - Subscapularis: 5/5 strength with IR - Biceps tendon: Negative Speeds.  - AC Joint: Negative cross arm Skin: Warm, dry , no bruising, erythema,  Rash as below    Psychiatric/Behavioral:  normal mood, normal behavior   Assessment & Plan:   See Encounters Tab for problem based charting.  Patient discussed with Dr. Rogelia Boga

## 2019-11-20 NOTE — Assessment & Plan Note (Signed)
  Left Shoulder Pain Left shoulder pain for approximately 1 to 2 months.  Shoulder pain is aggravated by lifting weight and certain movements.  No focal weakness on exam or ROM limitations when compared to right side.  Patient reports he works a lot and would be unable to make it to physical therapy. Plan:  Naproxen 500 mg twice daily x2 weeks Shared shoulder workouts patient could attempt at home to strengthen rotator cuff

## 2019-11-20 NOTE — Patient Instructions (Addendum)
Thank you for trusting me with your care. To recap, today we discussed the following:   High blood pressure I sent a new prescription for amlodipine 10 mg  Allergies to food I recommend taking Zyrtec instead of Benadryl during the day I sent a referral for allergy testing  Left shoulder pain Apparent information he can read about rotator cuff injury I recommend starting home exercises for rotator cuff because you can not make it to physical therapy daily.   Finger numbness We will check your hemoglobin A1c today.  It is unlikely this is related to diabetes, but this test will tell me.   I will follow up tomorrow with the results of your lab work

## 2019-11-20 NOTE — Assessment & Plan Note (Signed)
Hypertension: Patient's BP today is 161/81 with a goal of <140/80. The patient has not been taking his amlodipine for 1 year. He  denied, chest pain, headache, and visual changes.  Patient has a history of subarachnoid hemorrhage after a fall per chart.  Discussed limiting sodium intake.   Plan: Start Amlodipine 10  mg daily  Follow up in 4 weeks, instruction given to take BP for one week prior to visit

## 2019-11-21 LAB — BMP8+ANION GAP
Anion Gap: 17 mmol/L (ref 10.0–18.0)
BUN/Creatinine Ratio: 12 (ref 9–20)
BUN: 9 mg/dL (ref 6–24)
CO2: 23 mmol/L (ref 20–29)
Calcium: 10 mg/dL (ref 8.7–10.2)
Chloride: 96 mmol/L (ref 96–106)
Creatinine, Ser: 0.78 mg/dL (ref 0.76–1.27)
GFR calc Af Amer: 116 mL/min/{1.73_m2} (ref >59)
GFR calc non Af Amer: 100 mL/min/{1.73_m2} (ref >59)
Glucose: 84 mg/dL (ref 65–99)
Potassium: 4 mmol/L (ref 3.5–5.2)
Sodium: 136 mmol/L (ref 134–144)

## 2019-11-21 LAB — CBC WITH DIFFERENTIAL/PLATELET
Basophils Absolute: 0.1 10*3/uL (ref 0.0–0.2)
Basos: 1 %
EOS (ABSOLUTE): 0.1 10*3/uL (ref 0.0–0.4)
Eos: 1 %
Hematocrit: 43.1 % (ref 37.5–51.0)
Hemoglobin: 15.5 g/dL (ref 13.0–17.7)
Immature Grans (Abs): 0 10*3/uL (ref 0.0–0.1)
Immature Granulocytes: 0 %
Lymphocytes Absolute: 2.5 10*3/uL (ref 0.7–3.1)
Lymphs: 22 %
MCH: 32.4 pg (ref 26.6–33.0)
MCHC: 36 g/dL — ABNORMAL HIGH (ref 31.5–35.7)
MCV: 90 fL (ref 79–97)
Monocytes Absolute: 1.2 10*3/uL — ABNORMAL HIGH (ref 0.1–0.9)
Monocytes: 10 %
Neutrophils Absolute: 7.9 10*3/uL — ABNORMAL HIGH (ref 1.4–7.0)
Neutrophils: 66 %
Platelets: 314 10*3/uL (ref 150–450)
RBC: 4.79 x10E6/uL (ref 4.14–5.80)
RDW: 13.3 % (ref 11.6–15.4)
WBC: 11.8 10*3/uL — ABNORMAL HIGH (ref 3.4–10.8)

## 2019-11-21 LAB — LIPID PANEL
Chol/HDL Ratio: 3.5 ratio (ref 0.0–5.0)
Cholesterol, Total: 246 mg/dL — ABNORMAL HIGH (ref 100–199)
HDL: 71 mg/dL (ref >39)
LDL Chol Calc (NIH): 161 mg/dL — ABNORMAL HIGH (ref 0–99)
Triglycerides: 80 mg/dL (ref 0–149)
VLDL Cholesterol Cal: 14 mg/dL (ref 5–40)

## 2019-11-21 NOTE — Progress Notes (Signed)
Internal Medicine Clinic Attending ° °Case discussed with Dr. Steen  at the time of the visit.  We reviewed the resident’s history and exam and pertinent patient test results.  I agree with the assessment, diagnosis, and plan of care documented in the resident’s note.  °

## 2019-12-20 ENCOUNTER — Ambulatory Visit: Payer: Commercial Managed Care - PPO | Admitting: Internal Medicine

## 2019-12-20 ENCOUNTER — Other Ambulatory Visit: Payer: Self-pay

## 2019-12-20 DIAGNOSIS — I10 Essential (primary) hypertension: Secondary | ICD-10-CM

## 2019-12-20 NOTE — Progress Notes (Deleted)
I called patient for telephone encounter, but he did not answer.

## 2019-12-23 NOTE — Progress Notes (Signed)
This was in error, patient did not answer phone for telephone encounter

## 2019-12-25 ENCOUNTER — Encounter: Payer: Self-pay | Admitting: Allergy and Immunology

## 2019-12-25 ENCOUNTER — Other Ambulatory Visit: Payer: Self-pay

## 2019-12-25 ENCOUNTER — Ambulatory Visit (INDEPENDENT_AMBULATORY_CARE_PROVIDER_SITE_OTHER): Payer: Commercial Managed Care - PPO | Admitting: Allergy and Immunology

## 2019-12-25 VITALS — BP 168/88 | HR 83 | Temp 98.1°F | Resp 18 | Ht 69.5 in | Wt 150.4 lb

## 2019-12-25 DIAGNOSIS — T7840XA Allergy, unspecified, initial encounter: Secondary | ICD-10-CM | POA: Insufficient documentation

## 2019-12-25 DIAGNOSIS — K2970 Gastritis, unspecified, without bleeding: Secondary | ICD-10-CM | POA: Diagnosis not present

## 2019-12-25 DIAGNOSIS — T7840XD Allergy, unspecified, subsequent encounter: Secondary | ICD-10-CM | POA: Diagnosis not present

## 2019-12-25 DIAGNOSIS — I10 Essential (primary) hypertension: Secondary | ICD-10-CM | POA: Diagnosis not present

## 2019-12-25 DIAGNOSIS — L500 Allergic urticaria: Secondary | ICD-10-CM

## 2019-12-25 HISTORY — DX: Allergic urticaria: L50.0

## 2019-12-25 MED ORDER — FAMOTIDINE 20 MG PO TABS: 20.0000 mg | ORAL_TABLET | Freq: Two times a day (BID) | ORAL | 5 refills | Status: AC

## 2019-12-25 MED ORDER — LEVOCETIRIZINE DIHYDROCHLORIDE 5 MG PO TABS: 5.0000 mg | ORAL_TABLET | Freq: Every day | ORAL | 5 refills | Status: AC | PRN

## 2019-12-25 NOTE — Progress Notes (Signed)
New Patient Note  RE: George Andrews MRN: [id number] DOB: 1961/08/21 Date of Office Visit: 12/25/2019  Referring provider: Bartholomew Crews, MD Primary care provider: Madalyn Rob, MD  Chief Complaint: Allergic Reaction, Urticaria, and Pruritus   History of present illness: George Andrews is a 59 y.o. male seen today in consultation requested by George Dresser, MD.  Since December 2020, George Andrews has experienced recurrent episodes of hives. The hives have appeared at different times over his chest, back, arms, legs, hands and flanks.  The lesions are described as erythematous, raised, and pruritic.  Individual hives last less than 24 hours without leaving residual pigmentation or bruising. He denies concomitant angioedema, cardiopulmonary symptoms and GI symptoms. He has not experienced unexpected weight loss, recurrent fevers or drenching night sweats.  No significant seasonal symptom variation has been noted nor have specific environmental triggers been identified. Seems like it may be worse with garlic or Ranch dressing but the hives have persisted despite avoiding these foods.  Otherwise, no specific medication, food, skin care product, detergent, soap, or other environmental triggers have been identified.. The symptoms do not seem to correlate with NSAIDs use or emotional stress. He did not have symptoms consistent with a respiratory tract infection at the time of symptom onset. George Andrews has tried to control symptoms with OTC antihistamines which have offered moderate temporary relief. He has been evaluated and treated in the urgent care for these symptoms. Skin biopsy has not been performed. Recently, he has been experiencing hives a few times a week. George Andrews has no history of symptoms consistent with allergic rhinitis, asthma, or atopic dermatitis.  Assessment and plan: Recurrent urticaria Unclear etiology. Skin tests to select food allergens were negative today. NSAIDs and emotional stress  commonly exacerbate urticaria but are not the underlying etiology in this case. Physical urticarias are negative by history (i.e. pressure-induced, temperature, vibration, solar, etc.). There are no concomitant symptoms concerning for anaphylaxis. We will rule out other potential etiologies with labs. For symptom relief, patient is to take oral antihistamines as directed.  The following labs have been ordered: FCeRI antibody, anti-thyroglobulin antibody, thyroid peroxidase antibody, tryptase, H. pylori urea breath test, CBC, CMP, ESR, ANA, and serum specific IgE against garlic and alpha-gal panel.  The patient will be called with further recommendations after lab results have returned.  Instructions have been discussed and provided for H1/H2 receptor blockade with titration to find lowest effective dose.  A prescription has been provided for levocetirizine (Xyzal), 5 mg daily as needed.  A prescription has been provided for famotidine (Pepcid), 88m twice daily as needed.  Should there be a significant increase or change in symptoms, a journal is to be kept recording any foods eaten, beverages consumed, medications taken within a 6 hour period prior to the onset of symptoms, as well as record activities being performed, and environmental conditions. For any symptoms concerning for anaphylaxis, 911 is to be called immediately.  Essential hypertension  The patient has been made aware of the elevated blood pressure reading and has been encouraged to follow up with his primary care physician in the near future regarding this issue.  CGarfieldhas verbalized understanding and agreed to do so.   Meds ordered this encounter  Medications  . levocetirizine (XYZAL) 5 MG tablet    Sig: Take 1 tablet (5 mg total) by mouth daily as needed for allergies.    Dispense:  30 tablet    Refill:  5  . famotidine (PEPCID) 20 MG tablet  Sig: Take 1 tablet (20 mg total) by mouth 2 (two) times daily.    Dispense:   60 tablet    Refill:  5    Diagnostics: Environmental skin testing: Negative despite a positive histamine control. Food allergen skin testing: Negative despite a positive histamine control.    Physical examination: Blood pressure (!) 168/88, pulse 83, temperature 98.1 F (36.7 C), temperature source Temporal, resp. rate 18, height 5' 9.5" (1.765 m), weight 150 lb 6.4 oz (68.2 kg), SpO2 98 %.  General: Alert, interactive, in no acute distress. HEENT: TMs pearly gray, turbinates mildly edematous without discharge, post-pharynx unremarkable. Neck: Supple without lymphadenopathy. Lungs: Clear to auscultation without wheezing, rhonchi or rales. CV: Normal S1, S2 without murmurs. Abdomen: Nondistended, nontender. Skin: Warm and dry, without lesions or rashes. Extremities:  No clubbing, cyanosis or edema. Neuro:   Grossly intact.  Review of systems:  Review of systems negative except as noted in HPI / PMHx or noted below: Review of Systems  Constitutional: Negative.   HENT: Negative.   Eyes: Negative.   Respiratory: Negative.   Cardiovascular: Negative.   Gastrointestinal: Negative.   Genitourinary: Negative.   Musculoskeletal: Negative.   Skin: Negative.   Neurological: Negative.   Endo/Heme/Allergies: Negative.   Psychiatric/Behavioral: Negative.     Past medical history:  Past Medical History:  Diagnosis Date  . Allergic urticaria 12/25/2019  . Hypertension     Past surgical history:  History reviewed. No pertinent surgical history.  Family history: Family History  Problem Relation Age of Onset  . Allergic rhinitis Neg Hx   . Angioedema Neg Hx   . Asthma Neg Hx   . Atopy Neg Hx   . Eczema Neg Hx   . Immunodeficiency Neg Hx   . Urticaria Neg Hx     Social history: Social History   Socioeconomic History  . Marital status: Single    Spouse name: Not on file  . Number of children: Not on file  . Years of education: Not on file  . Highest education level:  Not on file  Occupational History  . Not on file  Tobacco Use  . Smoking status: Current Every Day Smoker    Packs/day: 0.50    Types: Cigarettes  . Smokeless tobacco: Never Used  . Tobacco comment: Smokes 1/2 PPD  Substance and Sexual Activity  . Alcohol use: Yes    Alcohol/week: 0.0 standard drinks  . Drug use: No  . Sexual activity: Not on file  Other Topics Concern  . Not on file  Social History Narrative  . Not on file   Social Determinants of Health   Financial Resource Strain:   . Difficulty of Paying Living Expenses:   Food Insecurity:   . Worried About Charity fundraiser in the Last Year:   . Arboriculturist in the Last Year:   Transportation Needs:   . Film/video editor (Medical):   Marland Kitchen Lack of Transportation (Non-Medical):   Physical Activity:   . Days of Exercise per Week:   . Minutes of Exercise per Session:   Stress:   . Feeling of Stress :   Social Connections:   . Frequency of Communication with Friends and Family:   . Frequency of Social Gatherings with Friends and Family:   . Attends Religious Services:   . Active Member of Clubs or Organizations:   . Attends Archivist Meetings:   Marland Kitchen Marital Status:   Intimate Partner Violence:   .  Fear of Current or Ex-Partner:   . Emotionally Abused:   Marland Kitchen Physically Abused:   . Sexually Abused:     Environmental History: The patient lives in a 58 year old house with carpeting throughout, gassy, and central air.  There is no known mold/water damage in the home.  There are no pets in the home.  He is a cigarette smoker.  Current Outpatient Medications  Medication Sig Dispense Refill  . amLODipine (NORVASC) 10 MG tablet Take 1 tablet (10 mg total) by mouth daily. 30 tablet 1  . famotidine (PEPCID) 20 MG tablet Take 1 tablet (20 mg total) by mouth 2 (two) times daily. 60 tablet 5  . levocetirizine (XYZAL) 5 MG tablet Take 1 tablet (5 mg total) by mouth daily as needed for allergies. 30 tablet 5   No  current facility-administered medications for this visit.    Known medication allergies: No Known Allergies  I appreciate the opportunity to take part in George Andrews's care. Please do not hesitate to contact me with questions.  Sincerely,   R. Edgar Frisk, MD

## 2019-12-25 NOTE — Assessment & Plan Note (Signed)
   The patient has been made aware of the elevated blood pressure reading and has been encouraged to follow up with his primary care physician in the near future regarding this issue.  George Andrews has verbalized understanding and agreed to do so.

## 2019-12-25 NOTE — Patient Instructions (Addendum)
Recurrent urticaria Unclear etiology. Skin tests to select food allergens were negative today. NSAIDs and emotional stress commonly exacerbate urticaria but are not the underlying etiology in this case. Physical urticarias are negative by history (i.e. pressure-induced, temperature, vibration, solar, etc.). There are no concomitant symptoms concerning for anaphylaxis. We will rule out other potential etiologies with labs. For symptom relief, patient is to take oral antihistamines as directed.  The following labs have been ordered: FCeRI antibody, anti-thyroglobulin antibody, thyroid peroxidase antibody, tryptase, H. pylori urea breath test, CBC, CMP, ESR, ANA, and serum specific IgE against garlic and alpha-gal panel.  The patient will be called with further recommendations after lab results have returned.  Instructions have been discussed and provided for H1/H2 receptor blockade with titration to find lowest effective dose.  A prescription has been provided for levocetirizine (Xyzal), 5 mg daily as needed.  A prescription has been provided for famotidine (Pepcid), 80m twice daily as needed.  Should there be a significant increase or change in symptoms, a journal is to be kept recording any foods eaten, beverages consumed, medications taken within a 6 hour period prior to the onset of symptoms, as well as record activities being performed, and environmental conditions. For any symptoms concerning for anaphylaxis, 911 is to be called immediately.  Essential hypertension  The patient has been made aware of the elevated blood pressure reading and has been encouraged to follow up with his primary care physician in the near future regarding this issue.  CRafielhas verbalized understanding and agreed to do so.   When lab results have returned you will be called with further recommendations. With the newly implemented Cures Act, the labs may be visible to you at the same time they become visible to  [place] However, the results will typically not be addressed until all of the results are back, so please be patient.  Until you have heard from [place] please continue the treatment plan as outlined on your take home sheet.  Urticaria (Hives)  . Levocetirizine (Xyzal) 5 mg twice a day and famotidine (Pepcid) 20 mg twice a day. If no symptoms for 7-14 days then decrease to. . Levocetirizine (Xyzal) 5 mg twice a day and famotidine (Pepcid) 20 mg once a day.  If no symptoms for 7-14 days then decrease to. . Levocetirizine (Xyzal) 5 mg twice a day.  If no symptoms for 7-14 days then decrease to. . Levocetirizine (Xyzal) 5 mg once a day.  May use Benadryl (diphenhydramine) as needed for breakthrough symptoms       If symptoms return, then step up dosage

## 2019-12-25 NOTE — Assessment & Plan Note (Addendum)
Unclear etiology. Skin tests to select food allergens were negative today. NSAIDs and emotional stress commonly exacerbate urticaria but are not the underlying etiology in this case. Physical urticarias are negative by history (i.e. pressure-induced, temperature, vibration, solar, etc.). There are no concomitant symptoms concerning for anaphylaxis. We will rule out other potential etiologies with labs. For symptom relief, patient is to take oral antihistamines as directed.  The following labs have been ordered: FCeRI antibody, anti-thyroglobulin antibody, thyroid peroxidase antibody, tryptase, H. pylori urea breath test, CBC, CMP, ESR, ANA, and serum specific IgE against garlic and alpha-gal panel.  The patient will be called with further recommendations after lab results have returned.  Instructions have been discussed and provided for H1/H2 receptor blockade with titration to find lowest effective dose.  A prescription has been provided for levocetirizine (Xyzal), 5 mg daily as needed.  A prescription has been provided for famotidine (Pepcid), 39m twice daily as needed.  Should there be a significant increase or change in symptoms, a journal is to be kept recording any foods eaten, beverages consumed, medications taken within a 6 hour period prior to the onset of symptoms, as well as record activities being performed, and environmental conditions. For any symptoms concerning for anaphylaxis, 911 is to be called immediately.

## 2020-01-16 ENCOUNTER — Other Ambulatory Visit: Payer: Self-pay | Admitting: Internal Medicine

## 2020-01-16 DIAGNOSIS — I10 Essential (primary) hypertension: Secondary | ICD-10-CM

## 2020-03-17 ENCOUNTER — Other Ambulatory Visit: Payer: Self-pay | Admitting: Internal Medicine

## 2020-03-17 DIAGNOSIS — I10 Essential (primary) hypertension: Secondary | ICD-10-CM

## 2020-04-27 ENCOUNTER — Encounter (HOSPITAL_COMMUNITY): Payer: Self-pay

## 2020-04-27 ENCOUNTER — Ambulatory Visit (HOSPITAL_COMMUNITY)
Admission: EM | Admit: 2020-04-27 | Discharge: 2020-04-27 | Disposition: A | Discharge status: Home / Self Care | Payer: Commercial Managed Care - PPO | Attending: Physician Assistant | Admitting: Physician Assistant

## 2020-04-27 ENCOUNTER — Other Ambulatory Visit: Payer: Self-pay

## 2020-04-27 DIAGNOSIS — R21 Rash and other nonspecific skin eruption: Secondary | ICD-10-CM | POA: Diagnosis not present

## 2020-04-27 MED ORDER — CETIRIZINE HCL 10 MG PO TABS: 10.0000 mg | ORAL_TABLET | Freq: Every day | ORAL | 0 refills | Status: AC

## 2020-04-27 MED ORDER — HYDROCORTISONE 1 % EX OINT: 1.0000 "application " | TOPICAL_OINTMENT | Freq: Two times a day (BID) | CUTANEOUS | 0 refills | Status: AC

## 2020-04-27 NOTE — ED Triage Notes (Signed)
Pt present skin irration, that has been going on for a couple of weeks.

## 2020-04-27 NOTE — Discharge Instructions (Signed)
Take the zyrtec for itch  Apply hydrocortisone to bite spots  Follow up with your primary care

## 2020-04-27 NOTE — ED Provider Notes (Signed)
[facility]    CSN: [id number] Arrival date & time: 04/27/20  1609      History   Chief Complaint Chief Complaint  Patient presents with   Rash    HPI George Andrews is a 59 y.o. male.   Patient reports for concern of bites on his legs and arms.  Reports over the last few weeks he has noticed small red marks that are coming and going in his legs.  He wonders about scabies.  He has had this before.  However he states this is different.  He reports the scabies outbreak was when he was in a friend's home with known scabies.  He has not been any such residences.  He reports he has been sitting outside and has been bitten by mosquitoes.     Past Medical History:  Diagnosis Date   Allergic urticaria 12/25/2019   Hypertension     Patient Active Problem List   Diagnosis Date Noted   Recurrent urticaria 12/25/2019   Allergic reaction 12/25/2019   Multiple food allergies 11/20/2019   Shoulder pain, left 11/20/2019   Bilateral finger numbness 11/20/2019   Itching 02/03/2017   Non-intractable vomiting with nausea 02/03/2017   Transaminitis 11/21/2015   Lateral femoral cutaneous neuropathy 11/21/2015   Tobacco abuse 02/06/2015   Preventative health care 11/15/2014   Sciatica 11/15/2014   Essential hypertension 11/08/2014   SAH (subarachnoid hemorrhage) (HCC) 11/08/2014   Alcohol abuse with intoxication (HCC) 11/05/2014    History reviewed. No pertinent surgical history.     Home Medications    Prior to Admission medications   Medication Sig Start Date End Date Taking? Authorizing Provider  amLODipine (NORVASC) 10 MG tablet TAKE 1 TABLET(10 MG) BY MOUTH DAILY 03/18/20   Bloomfield, Carley D, DO  cetirizine (ZYRTEC ALLERGY) 10 MG tablet Take 1 tablet (10 mg total) by mouth daily. 04/27/20   Ondria Oswald, Veryl Speak, PA-C  famotidine (PEPCID) 20 MG tablet Take 1 tablet (20 mg total) by mouth 2 (two) times daily. 12/25/19   Bobbitt, Heywood Iles, MD    hydrocortisone 1 % ointment Apply 1 application topically 2 (two) times daily. 04/27/20   Kandise Riehle, Veryl Speak, PA-C  levocetirizine (XYZAL) 5 MG tablet Take 1 tablet (5 mg total) by mouth daily as needed for allergies. 12/25/19   Bobbitt, Heywood Iles, MD    Family History Family History  Problem Relation Age of Onset   Allergic rhinitis Neg Hx    Angioedema Neg Hx    Asthma Neg Hx    Atopy Neg Hx    Eczema Neg Hx    Immunodeficiency Neg Hx    Urticaria Neg Hx     Social History Social History   Tobacco Use   Smoking status: Current Every Day Smoker    Packs/day: 0.50    Types: Cigarettes   Smokeless tobacco: Never Used   Tobacco comment: Smokes 1/2 PPD  Substance Use Topics   Alcohol use: Yes    Alcohol/week: 0.0 standard drinks   Drug use: No     Allergies   Patient has no known allergies.   Review of Systems Review of Systems   Physical Exam Triage Vital Signs ED Triage Vitals  Enc Vitals Group     BP 04/27/20 1638 (!) 161/79     Pulse Rate 04/27/20 1638 86     Resp 04/27/20 1638 16     Temp 04/27/20 1638 98.2 F (36.8 C)     Temp Source 04/27/20 1638  Oral     SpO2 04/27/20 1638 98 %     Weight --      Height --      Head Circumference --      Peak Flow --      Pain Score 04/27/20 1639 0     Pain Loc --      Pain Edu? --      Excl. in GC? --    No data found.  Updated Vital Signs BP (!) 161/79 (BP Location: Right Arm)    Pulse 86    Temp 98.2 F (36.8 C) (Oral)    Resp 16    SpO2 98%   Visual Acuity Right Eye Distance:   Left Eye Distance:   Bilateral Distance:    Right Eye Near:   Left Eye Near:    Bilateral Near:     Physical Exam Vitals and nursing note reviewed.  Skin:    Comments: Patient shows: 1 small area of papular erythema on the left lower thigh.  1 similar lesion on the right ankle.  Patient points to other areas that do not show any lesions.  There are no tracking or punctate bite marks.      UC Treatments /  Results  Labs (all labs ordered are listed, but only abnormal results are displayed) Labs Reviewed - No data to display  EKG   Radiology No results found.  Procedures Procedures (including critical care time)  Medications Ordered in UC Medications - No data to display  Initial Impression / Assessment and Plan / UC Course  I have reviewed the triage vital signs and the nursing notes.  Pertinent labs & imaging results that were available during my care of the patient were reviewed by me and considered in my medical decision making (see chart for details).     #Rash Patient is a 58 year old presenting with concern of rash.  Does not appear to be scabies.  Does not appear to be urticarial.  Most likely this is some sort of insect bite to the outside.  Recommend Zyrtec for itching hydrocortisone on the spots.  Strict and follow-up with his primary care if not improving or to return if worsening.  Patient verbalized understanding plan of care Final Clinical Impressions(s) / UC Diagnoses   Final diagnoses:  Rash     Discharge Instructions     Take the zyrtec for itch  Apply hydrocortisone to bite spots  Follow up with your primary care     ED Prescriptions    Medication Sig Dispense Auth. Provider   cetirizine (ZYRTEC ALLERGY) 10 MG tablet Take 1 tablet (10 mg total) by mouth daily. 14 tablet Marissah Vandemark, Veryl Speak, PA-C   hydrocortisone 1 % ointment Apply 1 application topically 2 (two) times daily. 30 g Haelee Bolen, Veryl Speak, PA-C     PDMP not reviewed this encounter.   Hermelinda Medicus, PA-C 04/27/20 2152

## 2020-05-21 ENCOUNTER — Other Ambulatory Visit: Payer: Self-pay | Admitting: Internal Medicine

## 2020-05-21 DIAGNOSIS — I10 Essential (primary) hypertension: Secondary | ICD-10-CM

## 2020-07-31 ENCOUNTER — Other Ambulatory Visit: Payer: Self-pay

## 2020-07-31 DIAGNOSIS — I10 Essential (primary) hypertension: Secondary | ICD-10-CM

## 2020-08-01 MED ORDER — AMLODIPINE BESYLATE 10 MG PO TABS: ORAL_TABLET | ORAL | 1 refills | Status: DC

## 2020-08-01 NOTE — Telephone Encounter (Signed)
George Andrews was supposed to follow-up regarding his HTN after the original prescription of this amlodipine on 11/20/19 by Dr. Barbaraann Faster. Will forward to have him scheduled for follow-up.

## 2020-08-06 NOTE — Telephone Encounter (Signed)
Attempted to contact patient today via telephone no answer. Voice mail is not set up.  Appointment scheduled for 09/06/2020 at 9:45 am and mailed to patient.

## 2020-09-06 ENCOUNTER — Encounter: Payer: Commercial Managed Care - PPO | Admitting: Student

## 2020-10-03 ENCOUNTER — Other Ambulatory Visit: Payer: Self-pay | Admitting: Student

## 2020-10-03 DIAGNOSIS — I10 Essential (primary) hypertension: Secondary | ICD-10-CM

## 2020-10-03 NOTE — Telephone Encounter (Signed)
Please schedule patient for follow up with his PCP when available

## 2020-10-08 ENCOUNTER — Encounter: Payer: Self-pay | Admitting: Student

## 2020-10-30 ENCOUNTER — Ambulatory Visit (HOSPITAL_COMMUNITY)
Admission: RE | Admit: 2020-10-30 | Discharge: 2020-10-30 | Disposition: A | Discharge status: Home / Self Care | Payer: Commercial Managed Care - PPO | Source: Ambulatory Visit | Attending: Internal Medicine | Admitting: Internal Medicine

## 2020-10-30 ENCOUNTER — Other Ambulatory Visit: Payer: Self-pay

## 2020-10-30 ENCOUNTER — Ambulatory Visit (INDEPENDENT_AMBULATORY_CARE_PROVIDER_SITE_OTHER): Payer: Commercial Managed Care - PPO | Admitting: Internal Medicine

## 2020-10-30 ENCOUNTER — Encounter: Payer: Self-pay | Admitting: Internal Medicine

## 2020-10-30 VITALS — BP 128/66 | HR 93 | Temp 98.6°F | Ht 69.0 in | Wt 158.3 lb

## 2020-10-30 DIAGNOSIS — Z0000 Encounter for general adult medical examination without abnormal findings: Secondary | ICD-10-CM

## 2020-10-30 DIAGNOSIS — I10 Essential (primary) hypertension: Secondary | ICD-10-CM

## 2020-10-30 DIAGNOSIS — M79642 Pain in left hand: Secondary | ICD-10-CM | POA: Insufficient documentation

## 2020-10-30 DIAGNOSIS — E782 Mixed hyperlipidemia: Secondary | ICD-10-CM | POA: Diagnosis not present

## 2020-10-30 DIAGNOSIS — Z720 Tobacco use: Secondary | ICD-10-CM

## 2020-10-30 NOTE — Assessment & Plan Note (Signed)
BP Readings from Last 3 Encounters:  10/30/20 128/66  04/27/20 (!) 161/79  12/25/19 (!) 168/88   BP at goal. Currently on amlodipine 10mg . Advised on importance of low salt diet. Currently denies any light-headedness or dizziness.   A/P C/w current regimen at amlodipine 10mg  daily Check bmp to assess for renal involvement

## 2020-10-30 NOTE — Assessment & Plan Note (Signed)
Currently smokes about half pack daily. Not interested in cessation at this time.

## 2020-10-30 NOTE — Patient Instructions (Addendum)
Thank you for allowing [place] to provide your care today. Today we discussed your hand pain    I have ordered X-ray for you. I will call if any are abnormal.    Today we made no changes to your medications.    Please follow-up in 12 months.    Should you have any questions or concerns please call the internal medicine clinic at [phone].

## 2020-10-30 NOTE — Assessment & Plan Note (Addendum)
George Andrews is a 60 yo M w/ PMH of HTN presenting to [facility] with complaint of hand pain. He describes the pain as shooting pain with slipping sensation that occurs with full flexion of his Left middle finger. 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 He states he had previously had workplace accident of the left hand three years ago abut he did not have this symptom at the time. He mentions the pain appears to be gradually worsening over the course of 1 year. Pain is not severe enough to limit daily activities or functioning at work.  A/P Present with pain of his left middle finger. Physical exam suggest tendinopathy. Duties at work suggest overuse as etiology. No inciting trauma or accident. Unusual for pain to last so long. Will get X-ray to rule out fracture - Hand X-ray - Treat pain with OTC tylenol as needed  Addendum: X-ray showing no acute fracture. Attempted to call patient to discuss results and find coping mechanism. Patient did not pick up. Left voicemail with callback number.

## 2020-10-30 NOTE — Assessment & Plan Note (Addendum)
Lab Results  Component Value Date   CHOL 246 (H) 11/20/2019   HDL 71 11/20/2019   LDLCALC 161 (H) 11/20/2019   TRIG 80 11/20/2019   CHOLHDL 3.5 11/20/2019   Based on last year's lipid profile, ASCVD risk elevated at 19% with recommendation for moderate intensity statin therapy per AHA/ACC guidelines. Mr.Stolz remains resistant to starting statin therapy and would like to pursue lifestyle modifications.  A/P Need statin but pt refused - C/w encourage lifestyle modifications - Will recheck lipid profile for this year  Addendum: Attempted to speak with Mr.Encalada to discuss need to start high-intensity statin based on most recent lipid results. Mr.Carrero did not pick up. Left voicemail with call-back number

## 2020-10-30 NOTE — Assessment & Plan Note (Signed)
Declined colon cancer screening or influenza/covid vaccination

## 2020-10-30 NOTE — Progress Notes (Signed)
   CC: Finger pain  HPI: [name] is a 60 y.o. with PMH listed below presenting with complaint of finger pain. Please see problem based assessment and plan for further details.  Past Medical History:  Diagnosis Date  . Allergic urticaria 12/25/2019  . Hypertension     Review of Systems: Review of Systems  Constitutional: Negative for chills, fever and malaise/fatigue.  Eyes: Negative for blurred vision.  Respiratory: Negative for shortness of breath.   Cardiovascular: Negative for chest pain, palpitations and leg swelling.  Gastrointestinal: Negative for constipation, diarrhea, nausea and vomiting.  All other systems reviewed and are negative.    Physical Exam: Vitals:   10/30/20 1423 10/30/20 1456  BP: 135/68 128/66  Pulse: 93   Temp: 98.6 F (37 C)   TempSrc: Oral   SpO2: 99%   Weight: 158 lb 4.8 oz (71.8 kg)   Height: 5\' 9"  (1.753 m)     Gen: Well-developed, well nourished, NAD HEENT: NCAT head, hearing intact CV: RRR, S1, S2 normal Pulm: CTAB, No rales, no wheezes Extm: ROM intact, Peripheral pulses intact, L middle finger with limited active and passive ROM due to pain on full flexion. Able to perform limited flexion. No obvious effusion or tenderness to palpation. No other obvious joint deformities. Skin: Dry, Warm, normal turgor  Assessment & Plan:   Essential hypertension BP Readings from Last 3 Encounters:  10/30/20 128/66  04/27/20 (!) 161/79  12/25/19 (!) 168/88   BP at goal. Currently on amlodipine 10mg . Advised on importance of low salt diet. Currently denies any light-headedness or dizziness.   A/P C/w current regimen at amlodipine 10mg  daily Check bmp to assess for renal involvement  Mixed hyperlipidemia Lab Results  Component Value Date   CHOL 246 (H) 11/20/2019   HDL 71 11/20/2019   LDLCALC 161 (H) 11/20/2019   TRIG 80 11/20/2019   CHOLHDL 3.5 11/20/2019   Based on last year's lipid profile, ASCVD risk elevated at 19% with  recommendation for moderate intensity statin therapy per AHA/ACC guidelines. [name] remains resistant to starting statin therapy and would like to pursue lifestyle modifications.  A/P Need statin but pt refused - C/w encourage lifestyle modifications - Will recheck lipid profile for this year  Hand pain, left [name] is a 60 yo M w/ PMH of HTN presenting to [facility] with complaint of hand pain. He describes the pain as shooting pain with slipping sensation that occurs with full flexion of his Left middle finger. 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 He states he had previously had workplace accident of the left hand three years ago abut he did not have this symptom at the time. He mentions the pain appears to be gradually worsening over the course of 1 year. Pain is not severe enough to limit daily activities or functioning at work.  A/P Present with pain of his left middle finger. Physical exam suggest tendinopathy. Duties at work suggest overuse as etiology. No inciting trauma or accident. Unusual for pain to last so long. Will get X-ray to rule out fracture - Hand X-ray - Treat pain with OTC tylenol as needed  Tobacco abuse Currently smokes about half pack daily. Not interested in cessation at this time.    Patient discussed with Dr. Reyes Ivan  -42, PGY3 [facility] Internal Medicine Pager: [phone]

## 2020-10-31 LAB — BMP8+ANION GAP
Anion Gap: 17 mmol/L (ref 10.0–18.0)
BUN/Creatinine Ratio: 7 — ABNORMAL LOW (ref 9–20)
BUN: 7 mg/dL (ref 6–24)
CO2: 22 mmol/L (ref 20–29)
Calcium: 9.8 mg/dL (ref 8.7–10.2)
Chloride: 98 mmol/L (ref 96–106)
Creatinine, Ser: 0.96 mg/dL (ref 0.76–1.27)
Glucose: 94 mg/dL (ref 65–99)
Potassium: 4.3 mmol/L (ref 3.5–5.2)
Sodium: 137 mmol/L (ref 134–144)
eGFR: 92 mL/min/{1.73_m2} (ref >59)

## 2020-10-31 LAB — LIPID PANEL
Chol/HDL Ratio: 4.1 ratio (ref 0.0–5.0)
Cholesterol, Total: 278 mg/dL — ABNORMAL HIGH (ref 100–199)
HDL: 68 mg/dL (ref >39)
LDL Chol Calc (NIH): 193 mg/dL — ABNORMAL HIGH (ref 0–99)
Triglycerides: 99 mg/dL (ref 0–149)
VLDL Cholesterol Cal: 17 mg/dL (ref 5–40)

## 2020-11-01 NOTE — Progress Notes (Signed)
Internal Medicine Clinic Attending  Case discussed with Dr. Lee  At the time of the visit.  We reviewed the resident's history and exam and pertinent patient test results.  I agree with the assessment, diagnosis, and plan of care documented in the resident's note.    

## 2020-12-04 ENCOUNTER — Telehealth: Payer: Self-pay

## 2020-12-04 NOTE — Telephone Encounter (Signed)
Requesting lab results, please call pt back.  

## 2020-12-05 NOTE — Telephone Encounter (Signed)
Attempted to return call to George Andrews to discuss lab results. Patient did not pick up. Left voicemail with callback number.

## 2020-12-09 ENCOUNTER — Encounter: Payer: Commercial Managed Care - PPO | Admitting: Student

## 2021-02-12 ENCOUNTER — Other Ambulatory Visit: Payer: Self-pay | Admitting: Student

## 2021-02-12 DIAGNOSIS — I10 Essential (primary) hypertension: Secondary | ICD-10-CM

## 2021-11-24 ENCOUNTER — Emergency Department (HOSPITAL_COMMUNITY)
Admission: EM | Admit: 2021-11-24 | Discharge: 2021-11-25 | Disposition: A | Discharge status: Home / Self Care | Payer: Commercial Managed Care - PPO | Attending: Emergency Medicine | Admitting: Emergency Medicine

## 2021-11-24 ENCOUNTER — Encounter (HOSPITAL_COMMUNITY): Payer: Self-pay | Admitting: Emergency Medicine

## 2021-11-24 ENCOUNTER — Other Ambulatory Visit: Payer: Self-pay

## 2021-11-24 ENCOUNTER — Other Ambulatory Visit (HOSPITAL_COMMUNITY): Payer: Self-pay | Admitting: Radiology

## 2021-11-24 DIAGNOSIS — I10 Essential (primary) hypertension: Secondary | ICD-10-CM | POA: Insufficient documentation

## 2021-11-24 DIAGNOSIS — F17210 Nicotine dependence, cigarettes, uncomplicated: Secondary | ICD-10-CM | POA: Insufficient documentation

## 2021-11-24 DIAGNOSIS — M79622 Pain in left upper arm: Secondary | ICD-10-CM | POA: Diagnosis present

## 2021-11-24 DIAGNOSIS — J9811 Atelectasis: Secondary | ICD-10-CM | POA: Insufficient documentation

## 2021-11-24 DIAGNOSIS — Z79899 Other long term (current) drug therapy: Secondary | ICD-10-CM | POA: Diagnosis not present

## 2021-11-24 DIAGNOSIS — M79602 Pain in left arm: Secondary | ICD-10-CM

## 2021-11-24 NOTE — ED Triage Notes (Signed)
Pt states he is been send to ED by [facility] for some fluid accumulation on his heart to be evaluated for cardiac problems, pt initially when to pcp for arm pain. ?

## 2021-11-24 NOTE — ED Provider Triage Note (Signed)
Emergency Medicine Provider Triage Evaluation Note ? ?George Andrews , a 61 y.o. male  was evaluated in triage.  Patient reports he was sent to the ED by his outpatient provider due to concerns regarding his heart. States he has had some intermittent LUE pain prompting him to get checked out, he was told his EKG showed inflammation and that he should come to the ED. He reports pain is primarily to the elbow down, has some pain to the left lateral chest/axilla.. Washed his car on Friday which he thinks might have triggered all of this ? ?Review of Systems  ?Positive: Per above ?Negative: Dyspnea, hemoptysis, nausea, vomiting, diaphoresis.  ? ?Physical Exam  ?BP 135/72 (BP Location: Right Arm)   Pulse 88   Temp 98.1 ?F (36.7 ?C) (Oral)   Resp 17   Ht 5\' 9"  (1.753 m)   Wt 71.8 kg   SpO2 96%   BMI 23.38 kg/m?  ?Gen:   Awake, no distress   ?Resp:  Normal effort  ?MSK:   Moves extremities without difficulty, mild left elbow TTP ?Other:  2+ radial pulses.  ? ?Medical Decision Making  ?Medically screening exam initiated at 11:48 PM.  Appropriate orders placed.  LOIS OSTROM was informed that the remainder of the evaluation will be completed by another provider, this initial triage assessment does not replace that evaluation, and the importance of remaining in the ED until their evaluation is complete. ? ?Arm pain.  ?  ?George Gist, PA-C ?11/24/21 2350 ? ?

## 2021-11-25 ENCOUNTER — Emergency Department (HOSPITAL_COMMUNITY): Payer: Commercial Managed Care - PPO

## 2021-11-25 LAB — CBC WITH DIFFERENTIAL/PLATELET
Abs Immature Granulocytes: 0 10*3/uL (ref 0.00–0.07)
Basophils Absolute: 0 10*3/uL (ref 0.0–0.1)
Basophils Relative: 0 %
Eosinophils Absolute: 0.4 10*3/uL (ref 0.0–0.5)
Eosinophils Relative: 4 %
HCT: 44.8 % (ref 39.0–52.0)
Hemoglobin: 15 g/dL (ref 13.0–17.0)
Lymphocytes Relative: 26 %
Lymphs Abs: 2.4 10*3/uL (ref 0.7–4.0)
MCH: 31.4 pg (ref 26.0–34.0)
MCHC: 33.5 g/dL (ref 30.0–36.0)
MCV: 93.9 fL (ref 80.0–100.0)
Monocytes Absolute: 0.9 10*3/uL (ref 0.1–1.0)
Monocytes Relative: 10 %
Neutro Abs: 5.5 10*3/uL (ref 1.7–7.7)
Neutrophils Relative %: 60 %
Platelets: 209 10*3/uL (ref 150–400)
RBC: 4.77 MIL/uL (ref 4.22–5.81)
RDW: 13.8 % (ref 11.5–15.5)
WBC: 9.1 10*3/uL (ref 4.0–10.5)
nRBC: 0 % (ref 0.0–0.2)
nRBC: 0 /100 WBC

## 2021-11-25 LAB — COMPREHENSIVE METABOLIC PANEL
ALT: 24 U/L (ref 0–44)
AST: 28 U/L (ref 15–41)
Albumin: 3.7 g/dL (ref 3.5–5.0)
Alkaline Phosphatase: 60 U/L (ref 38–126)
Anion gap: 9 (ref 5–15)
BUN: 9 mg/dL (ref 6–20)
CO2: 23 mmol/L (ref 22–32)
Calcium: 9.7 mg/dL (ref 8.9–10.3)
Chloride: 105 mmol/L (ref 98–111)
Creatinine, Ser: 0.81 mg/dL (ref 0.61–1.24)
GFR, Estimated: >60 mL/min (ref >60)
Glucose, Bld: 103 mg/dL — ABNORMAL HIGH (ref 70–99)
Potassium: 4.3 mmol/L (ref 3.5–5.1)
Sodium: 137 mmol/L (ref 135–145)
Total Bilirubin: 0.5 mg/dL (ref 0.3–1.2)
Total Protein: 7.8 g/dL (ref 6.5–8.1)

## 2021-11-25 LAB — TROPONIN I (HIGH SENSITIVITY)
Troponin I (High Sensitivity): 7 ng/L (ref <18)
Troponin I (High Sensitivity): 10 ng/L (ref <18)

## 2021-11-25 NOTE — Discharge Instructions (Signed)
As we discussed, your work-up in the ER was reassuring for acute abnormalities. I suspect that your symptoms were musculoskeletal in nature. Follow-up with your PCP for continued evaluation and management. ? ?Return if development of chest pain, shortness of breath, or any other new or worsening symptoms ?

## 2021-11-25 NOTE — ED Provider Notes (Signed)
?[facility] ?Provider Note ? ? ?CSN: [id number] ?Arrival date & time: 11/24/21  2211 ? ?  ? ?History ? ?Chief Complaint  ?Patient presents with  ? Arm Pain  ? ? ?George Andrews is a 61 y.o. male. ? ?Patient with history of hypertension presents today with complaint of left arm pain.  He states that on Friday he did yard work which included lifting and throwing heavy bags, woke up Saturday morning with pain in the left arm that starts around his elbow and radiates up into left shoulder.  Denies any specific injury, states that he took Tylenol for same with some improvement.  He states that the pain was intermittent in nature without discernible triggers.  States that the last time he felt pain was Sunday afternoon.  States that he went to work on Monday morning and was pain-free throughout the day, however still decided to schedule an appointment with his primary care doctor for further evaluation of his pain.  States that his primary care doctor did an EKG that showed potential inflammation around his heart and so he was sent here for further evaluation and management.  Patient continues to be pain-free.  Denies any history of heart problems.  No recent vaccinations or recent illness.  Patient smokes half a pack a day with around 2 drinks of alcohol a day.  No recreational drug use or IVDU. ? ?The history is provided by the patient. No language interpreter was used.  ?Arm Pain ?Pertinent negatives include no chest pain and no shortness of breath.  ? ?  ? ?Home Medications ?Prior to Admission medications   ?Medication Sig Start Date End Date Taking? Authorizing Provider  ?amLODipine (NORVASC) 10 MG tablet TAKE 1 TABLET(10 MG) BY MOUTH DAILY 02/13/21   Elige Radonhristian, Rylee, MD  ?cetirizine (ZYRTEC ALLERGY) 10 MG tablet Take 1 tablet (10 mg total) by mouth daily. 04/27/20   Darr, Gerilyn PilgrimJacob, PA-C  ?famotidine (PEPCID) 20 MG tablet Take 1 tablet (20 mg total) by mouth 2 (two) times daily.  12/25/19   Bobbitt, Heywood Ilesalph Carter, MD  ?hydrocortisone 1 % ointment Apply 1 application topically 2 (two) times daily. 04/27/20   Darr, Gerilyn PilgrimJacob, PA-C  ?levocetirizine (XYZAL) 5 MG tablet Take 1 tablet (5 mg total) by mouth daily as needed for allergies. 12/25/19   Bobbitt, Heywood Ilesalph Carter, MD  ?   ? ?Allergies    ?Patient has no known allergies.   ? ?Review of Systems   ?Review of Systems  ?Constitutional:  Negative for chills and fever.  ?Respiratory:  Negative for shortness of breath.   ?Cardiovascular:  Negative for chest pain.  ?Gastrointestinal:  Negative for nausea and vomiting.  ?Musculoskeletal:  Positive for arthralgias (now resolved).  ?All other systems reviewed and are negative. ? ?Physical Exam ?Updated Vital Signs ?BP 137/66   Pulse 79   Temp 98.2 ?F (36.8 ?C) (Oral)   Resp (!) 21   Ht 5\' 9"  (1.753 m)   Wt 71.8 kg   SpO2 98%   BMI 23.38 kg/m?  ?Physical Exam ?Vitals and nursing note reviewed.  ?Constitutional:   ?   General: He is not in acute distress. ?   Appearance: Normal appearance. He is normal weight. He is not ill-appearing, toxic-appearing or diaphoretic.  ?   Comments: Patient resting comfortably in bed in no acute distress  ?HENT:  ?   Head: Normocephalic and atraumatic.  ?Eyes:  ?   Extraocular Movements: Extraocular movements intact.  ?Cardiovascular:  ?  Rate and Rhythm: Normal rate and regular rhythm.  ?   Heart sounds: Normal heart sounds.  ?Pulmonary:  ?   Effort: Pulmonary effort is normal. No respiratory distress.  ?   Breath sounds: Normal breath sounds. No wheezing or rales.  ?Abdominal:  ?   General: Abdomen is flat.  ?   Palpations: Abdomen is soft.  ?Musculoskeletal:     ?   General: Normal range of motion.  ?   Cervical back: Normal range of motion.  ?   Comments: 5/5 strength and sensation to bilateral upper extremities. Bilateral radial pulses intact and 2+. No tenderness to palpation of right elbow, shoulder, or chest.  No obvious swelling, crepitus, wounds, or deformity.   ?Skin: ?   General: Skin is warm and dry.  ?Neurological:  ?   General: No focal deficit present.  ?   Mental Status: He is alert.  ?Psychiatric:     ?   Mood and Affect: Mood normal.     ?   Behavior: Behavior normal.  ? ? ?ED Results / Procedures / Treatments   ?Labs ?(all labs ordered are listed, but only abnormal results are displayed) ?Labs Reviewed  ?COMPREHENSIVE METABOLIC PANEL - Abnormal; Notable for the following components:  ?    Result Value  ? Glucose, Bld 103 (*)   ? All other components within normal limits  ?CBC WITH DIFFERENTIAL/PLATELET  ?TROPONIN I (HIGH SENSITIVITY)  ?TROPONIN I (HIGH SENSITIVITY)  ? ? ?EKG ?EKG Interpretation ? ?Date/Time:  Tuesday November 25 2021 09:15:03 EDT ?Ventricular Rate:  86 ?PR Interval:  164 ?QRS Duration: 87 ?QT Interval:  352 ?QTC Calculation: 421 ?R Axis:   54 ?Text Interpretation: Sinus rhythm Probable left ventricular hypertrophy ST elevation suggests acute pericarditis No significant change since last tracing Confirmed by Vanetta Mulders [phone]) on 11/25/2021 9:22:09 AM ? ?Radiology ?DG Chest 2 View ? ?Result Date: 11/25/2021 ?CLINICAL DATA:  Left arm pain. EXAM: CHEST - 2 VIEW COMPARISON:  November 05, 2014 FINDINGS: The heart size and mediastinal contours are within normal limits. Mild scarring and/or atelectasis is seen along the left apex. Both lungs are otherwise clear. The visualized skeletal structures are unremarkable. IMPRESSION: Mild left apical scarring and/or atelectasis. Electronically Signed   By: Aram Candela M.D.   On: 11/25/2021 00:14  ? ?DG Elbow Complete Left ? ?Result Date: 11/25/2021 ?CLINICAL DATA:  Left elbow pain. EXAM: LEFT ELBOW - COMPLETE 3+ VIEW COMPARISON:  None. FINDINGS: There is no acute fracture or dislocation. The bones are osteopenic. There is mild degenerative changes and spurring. No joint effusion. The soft tissues are unremarkable. IMPRESSION: No acute fracture or dislocation. Electronically Signed   By: Elgie Collard M.D.    On: 11/25/2021 00:14   ? ?Procedures ?Procedures  ? ? ?Medications Ordered in ED ?Medications - No data to display ? ?ED Course/ Medical Decision Making/ A&P ?  ?                        ?Medical Decision Making ? ?This patient presents to the ED for concern of left arm pain, this involves an extensive number of treatment options, and is a complaint that carries with it a high risk of complications and morbidity.  The differential diagnosis includes ACS, pericarditis, MSK pain.  This is not an exhaustive differential. ? ? ?Co morbidities that complicate the patient evaluation ? ?Hypertension ? ? ?Lab Tests: ? ?I Ordered, and personally interpreted labs.  The  pertinent results include: No leukocytosis or anemia, no electrolyte abnormalities, troponins negative. ? ? ?Imaging Studies ordered: ? ?I ordered imaging studies including CXR, DG left elbow ?CXR: Mild left apical scarring and/or atelectasis. ?DG left elbow: no fracture or dislocation ?I independently visualized and interpreted imaging which showed  ?I agree with the radiologist interpretation ? ? ?Cardiac Monitoring: / EKG: ? ?The patient was maintained on a cardiac monitor.  I personally viewed and interpreted the cardiac monitored which showed an underlying rhythm of: no STEMI, some ST elevation concerning for pericarditis, however unchanged from previous EKG ? ?Patient presents today with left arm pain after doing yard work on Friday.  This pain has been completely resolved since Sunday.  No cardiac history, and patient denies ever having any chest pain.  No previous illness, no recreational drug use or IVDU.  Pain was not positional denies any shortness of breath.  Labs and work-up negative for acute findings.  EKG did read pericarditis, however is unchanged from EKG 5 years ago.  Clinical picture not consistent with pericarditis.  Patient is afebrile, nontoxic-appearing, and in no acute distress with reassuring vital signs.  Presentation not consistent  with  Acute Coronary Syndrome (ACS) and/or myocardial ischemia, pulmonary embolism, aortic dissection; Borhaave's, significant arrythmia, pneumothorax, cardiac tamponade, or other emergent cardiopulmonary condition.
